# Patient Record
Sex: Female | Born: 1935 | Race: White | Hispanic: No | Smoking: Never smoker
Health system: Southern US, Community
[De-identification: ages and names within clinical notes are randomized; demographics above are authoritative.]

## PROBLEM LIST (undated history)

## (undated) DIAGNOSIS — E039 Hypothyroidism, unspecified: Secondary | ICD-10-CM

## (undated) DIAGNOSIS — M1990 Unspecified osteoarthritis, unspecified site: Secondary | ICD-10-CM

## (undated) DIAGNOSIS — I2510 Atherosclerotic heart disease of native coronary artery without angina pectoris: Secondary | ICD-10-CM

## (undated) DIAGNOSIS — I255 Ischemic cardiomyopathy: Secondary | ICD-10-CM

## (undated) DIAGNOSIS — I214 Non-ST elevation (NSTEMI) myocardial infarction: Secondary | ICD-10-CM

## (undated) DIAGNOSIS — C569 Malignant neoplasm of unspecified ovary: Secondary | ICD-10-CM

## (undated) DIAGNOSIS — N183 Chronic kidney disease, stage 3 (moderate): Secondary | ICD-10-CM

## (undated) DIAGNOSIS — I10 Essential (primary) hypertension: Secondary | ICD-10-CM

## (undated) DIAGNOSIS — I739 Peripheral vascular disease, unspecified: Secondary | ICD-10-CM

## (undated) DIAGNOSIS — R001 Bradycardia, unspecified: Secondary | ICD-10-CM

## (undated) DIAGNOSIS — I639 Cerebral infarction, unspecified: Secondary | ICD-10-CM

## (undated) DIAGNOSIS — I480 Paroxysmal atrial fibrillation: Secondary | ICD-10-CM

## (undated) DIAGNOSIS — K219 Gastro-esophageal reflux disease without esophagitis: Secondary | ICD-10-CM

## (undated) DIAGNOSIS — E785 Hyperlipidemia, unspecified: Secondary | ICD-10-CM

## (undated) DIAGNOSIS — C20 Malignant neoplasm of rectum: Secondary | ICD-10-CM

## (undated) DIAGNOSIS — F419 Anxiety disorder, unspecified: Secondary | ICD-10-CM

## (undated) DIAGNOSIS — E119 Type 2 diabetes mellitus without complications: Secondary | ICD-10-CM

## (undated) HISTORY — PX: CORONARY ARTERY BYPASS GRAFT: SHX141

## (undated) HISTORY — PX: TONSILLECTOMY: SUR1361

## (undated) HISTORY — PX: BACK SURGERY: SHX140

## (undated) HISTORY — PX: ABDOMINAL HYSTERECTOMY: SHX81

## (undated) HISTORY — PX: APPENDECTOMY: SHX54

---

## 1999-05-08 ENCOUNTER — Inpatient Hospital Stay (HOSPITAL_COMMUNITY): Admission: EM | Admit: 1999-05-08 | Discharge: 1999-05-17 | Payer: Self-pay | Admitting: Emergency Medicine

## 1999-05-08 ENCOUNTER — Encounter: Payer: Self-pay | Admitting: Cardiology

## 1999-05-12 ENCOUNTER — Encounter: Payer: Self-pay | Admitting: Thoracic Surgery (Cardiothoracic Vascular Surgery)

## 1999-05-13 ENCOUNTER — Encounter: Payer: Self-pay | Admitting: Thoracic Surgery (Cardiothoracic Vascular Surgery)

## 1999-05-15 ENCOUNTER — Encounter: Payer: Self-pay | Admitting: Emergency Medicine

## 2001-03-28 ENCOUNTER — Encounter: Payer: Self-pay | Admitting: Cardiovascular Disease

## 2001-03-28 ENCOUNTER — Ambulatory Visit (HOSPITAL_COMMUNITY): Admission: AD | Admit: 2001-03-28 | Discharge: 2001-03-29 | Payer: Self-pay | Admitting: Cardiovascular Disease

## 2001-12-11 ENCOUNTER — Encounter: Admission: RE | Admit: 2001-12-11 | Discharge: 2001-12-11 | Payer: Self-pay | Admitting: Orthopedic Surgery

## 2001-12-11 ENCOUNTER — Encounter: Payer: Self-pay | Admitting: Orthopedic Surgery

## 2001-12-12 ENCOUNTER — Ambulatory Visit (HOSPITAL_BASED_OUTPATIENT_CLINIC_OR_DEPARTMENT_OTHER): Admission: RE | Admit: 2001-12-12 | Discharge: 2001-12-12 | Payer: Self-pay | Admitting: Orthopedic Surgery

## 2002-11-26 ENCOUNTER — Ambulatory Visit (HOSPITAL_COMMUNITY): Admission: RE | Admit: 2002-11-26 | Discharge: 2002-11-27 | Payer: Self-pay | Admitting: Cardiovascular Disease

## 2005-01-22 ENCOUNTER — Encounter: Admission: RE | Admit: 2005-01-22 | Discharge: 2005-01-22 | Payer: Self-pay | Admitting: Cardiovascular Disease

## 2005-01-28 ENCOUNTER — Ambulatory Visit (HOSPITAL_COMMUNITY): Admission: RE | Admit: 2005-01-28 | Discharge: 2005-01-28 | Payer: Self-pay | Admitting: Cardiovascular Disease

## 2005-06-17 ENCOUNTER — Ambulatory Visit: Admission: RE | Admit: 2005-06-17 | Discharge: 2005-08-10 | Payer: Self-pay | Admitting: *Deleted

## 2005-06-18 ENCOUNTER — Ambulatory Visit: Payer: Self-pay | Admitting: Oncology

## 2005-07-02 ENCOUNTER — Ambulatory Visit: Payer: Self-pay | Admitting: Oncology

## 2005-10-29 ENCOUNTER — Ambulatory Visit: Payer: Self-pay | Admitting: Oncology

## 2006-01-21 ENCOUNTER — Ambulatory Visit: Payer: Self-pay | Admitting: Oncology

## 2006-04-15 ENCOUNTER — Ambulatory Visit: Payer: Self-pay | Admitting: Oncology

## 2006-08-05 ENCOUNTER — Ambulatory Visit: Payer: Self-pay | Admitting: Oncology

## 2006-10-28 ENCOUNTER — Ambulatory Visit: Payer: Self-pay | Admitting: Oncology

## 2006-12-21 ENCOUNTER — Ambulatory Visit: Payer: Self-pay | Admitting: Oncology

## 2007-02-15 ENCOUNTER — Ambulatory Visit: Payer: Self-pay | Admitting: Oncology

## 2007-04-13 ENCOUNTER — Ambulatory Visit: Payer: Self-pay | Admitting: Oncology

## 2007-11-27 ENCOUNTER — Ambulatory Visit (HOSPITAL_COMMUNITY): Admission: RE | Admit: 2007-11-27 | Discharge: 2007-11-28 | Payer: Self-pay | Admitting: Cardiovascular Disease

## 2008-11-22 ENCOUNTER — Inpatient Hospital Stay (HOSPITAL_COMMUNITY): Admission: AD | Admit: 2008-11-22 | Discharge: 2008-11-30 | Payer: Self-pay | Admitting: Cardiovascular Disease

## 2011-01-19 LAB — BASIC METABOLIC PANEL
BUN: 10 mg/dL (ref 6–23)
BUN: 18 mg/dL (ref 6–23)
BUN: 9 mg/dL (ref 6–23)
CO2: 19 mEq/L (ref 19–32)
Calcium: 8.5 mg/dL (ref 8.4–10.5)
Calcium: 8.9 mg/dL (ref 8.4–10.5)
Calcium: 9 mg/dL (ref 8.4–10.5)
Chloride: 110 mEq/L (ref 96–112)
Chloride: 117 mEq/L — ABNORMAL HIGH (ref 96–112)
Creatinine, Ser: 1.28 mg/dL — ABNORMAL HIGH (ref 0.4–1.2)
Creatinine, Ser: 1.36 mg/dL — ABNORMAL HIGH (ref 0.4–1.2)
Creatinine, Ser: 1.63 mg/dL — ABNORMAL HIGH (ref 0.4–1.2)
GFR calc Af Amer: 37 mL/min — ABNORMAL LOW (ref >60)
GFR calc Af Amer: 44 mL/min — ABNORMAL LOW (ref >60)
GFR calc non Af Amer: 31 mL/min — ABNORMAL LOW (ref >60)
GFR calc non Af Amer: 34 mL/min — ABNORMAL LOW (ref >60)
GFR calc non Af Amer: 36 mL/min — ABNORMAL LOW (ref >60)
GFR calc non Af Amer: 37 mL/min — ABNORMAL LOW (ref >60)
GFR calc non Af Amer: 40 mL/min — ABNORMAL LOW (ref >60)
Glucose, Bld: 118 mg/dL — ABNORMAL HIGH (ref 70–99)
Glucose, Bld: 134 mg/dL — ABNORMAL HIGH (ref 70–99)
Glucose, Bld: 148 mg/dL — ABNORMAL HIGH (ref 70–99)
Glucose, Bld: 63 mg/dL — ABNORMAL LOW (ref 70–99)
Potassium: 4.1 mEq/L (ref 3.5–5.1)
Potassium: 4.2 mEq/L (ref 3.5–5.1)
Sodium: 140 mEq/L (ref 135–145)
Sodium: 140 mEq/L (ref 135–145)
Sodium: 140 mEq/L (ref 135–145)
Sodium: 141 mEq/L (ref 135–145)
Sodium: 143 mEq/L (ref 135–145)

## 2011-01-19 LAB — GLUCOSE, CAPILLARY
Glucose-Capillary: 102 mg/dL — ABNORMAL HIGH (ref 70–99)
Glucose-Capillary: 106 mg/dL — ABNORMAL HIGH (ref 70–99)
Glucose-Capillary: 110 mg/dL — ABNORMAL HIGH (ref 70–99)
Glucose-Capillary: 128 mg/dL — ABNORMAL HIGH (ref 70–99)
Glucose-Capillary: 129 mg/dL — ABNORMAL HIGH (ref 70–99)
Glucose-Capillary: 132 mg/dL — ABNORMAL HIGH (ref 70–99)
Glucose-Capillary: 136 mg/dL — ABNORMAL HIGH (ref 70–99)
Glucose-Capillary: 137 mg/dL — ABNORMAL HIGH (ref 70–99)
Glucose-Capillary: 138 mg/dL — ABNORMAL HIGH (ref 70–99)
Glucose-Capillary: 144 mg/dL — ABNORMAL HIGH (ref 70–99)
Glucose-Capillary: 148 mg/dL — ABNORMAL HIGH (ref 70–99)
Glucose-Capillary: 153 mg/dL — ABNORMAL HIGH (ref 70–99)
Glucose-Capillary: 155 mg/dL — ABNORMAL HIGH (ref 70–99)
Glucose-Capillary: 155 mg/dL — ABNORMAL HIGH (ref 70–99)
Glucose-Capillary: 156 mg/dL — ABNORMAL HIGH (ref 70–99)
Glucose-Capillary: 160 mg/dL — ABNORMAL HIGH (ref 70–99)
Glucose-Capillary: 164 mg/dL — ABNORMAL HIGH (ref 70–99)
Glucose-Capillary: 167 mg/dL — ABNORMAL HIGH (ref 70–99)
Glucose-Capillary: 75 mg/dL (ref 70–99)

## 2011-01-19 LAB — HEPARIN LEVEL (UNFRACTIONATED)
Heparin Unfractionated: <0.1 IU/mL — ABNORMAL LOW (ref 0.30–0.70)
Heparin Unfractionated: <0.1 IU/mL — ABNORMAL LOW (ref 0.30–0.70)
Heparin Unfractionated: <0.1 IU/mL — ABNORMAL LOW (ref 0.30–0.70)

## 2011-01-19 LAB — CARDIAC PANEL(CRET KIN+CKTOT+MB+TROPI)
CK, MB: 2.9 ng/mL (ref 0.3–4.0)
CK, MB: 4.4 ng/mL — ABNORMAL HIGH (ref 0.3–4.0)
Relative Index: INVALID (ref 0.0–2.5)
Relative Index: INVALID (ref 0.0–2.5)
Total CK: 64 U/L (ref 7–177)
Total CK: 71 U/L (ref 7–177)
Total CK: 86 U/L (ref 7–177)
Troponin I: 0.22 ng/mL — ABNORMAL HIGH (ref 0.00–0.06)
Troponin I: 0.32 ng/mL — ABNORMAL HIGH (ref 0.00–0.06)
Troponin I: 0.38 ng/mL — ABNORMAL HIGH (ref 0.00–0.06)

## 2011-01-19 LAB — COMPREHENSIVE METABOLIC PANEL
ALT: 16 U/L (ref 0–35)
Albumin: 3.3 g/dL — ABNORMAL LOW (ref 3.5–5.2)
Alkaline Phosphatase: 75 U/L (ref 39–117)
BUN: 27 mg/dL — ABNORMAL HIGH (ref 6–23)
Calcium: 9.3 mg/dL (ref 8.4–10.5)
Glucose, Bld: 130 mg/dL — ABNORMAL HIGH (ref 70–99)
Potassium: 3.4 mEq/L — ABNORMAL LOW (ref 3.5–5.1)
Sodium: 139 mEq/L (ref 135–145)
Total Protein: 6.3 g/dL (ref 6.0–8.3)

## 2011-01-19 LAB — CBC
HCT: 32.8 % — ABNORMAL LOW (ref 36.0–46.0)
HCT: 33.3 % — ABNORMAL LOW (ref 36.0–46.0)
HCT: 34.2 % — ABNORMAL LOW (ref 36.0–46.0)
Hemoglobin: 11.4 g/dL — ABNORMAL LOW (ref 12.0–15.0)
Hemoglobin: 11.6 g/dL — ABNORMAL LOW (ref 12.0–15.0)
Hemoglobin: 11.6 g/dL — ABNORMAL LOW (ref 12.0–15.0)
Hemoglobin: 11.7 g/dL — ABNORMAL LOW (ref 12.0–15.0)
Hemoglobin: 12.4 g/dL (ref 12.0–15.0)
MCHC: 34.2 g/dL (ref 30.0–36.0)
MCHC: 34.8 g/dL (ref 30.0–36.0)
MCHC: 34.9 g/dL (ref 30.0–36.0)
MCV: 91.4 fL (ref 78.0–100.0)
Platelets: 137 10*3/uL — ABNORMAL LOW (ref 150–400)
Platelets: 149 10*3/uL — ABNORMAL LOW (ref 150–400)
Platelets: 153 10*3/uL (ref 150–400)
Platelets: 157 10*3/uL (ref 150–400)
RBC: 3.6 MIL/uL — ABNORMAL LOW (ref 3.87–5.11)
RDW: 12.8 % (ref 11.5–15.5)
RDW: 12.9 % (ref 11.5–15.5)
RDW: 13 % (ref 11.5–15.5)
RDW: 13.1 % (ref 11.5–15.5)
RDW: 13.2 % (ref 11.5–15.5)
WBC: 3.7 10*3/uL — ABNORMAL LOW (ref 4.0–10.5)
WBC: 4.7 10*3/uL (ref 4.0–10.5)
WBC: 6.4 10*3/uL (ref 4.0–10.5)

## 2011-01-19 LAB — APTT
aPTT: 32 seconds (ref 24–37)
aPTT: 50 seconds — ABNORMAL HIGH (ref 24–37)

## 2011-01-19 LAB — DIFFERENTIAL
Lymphs Abs: 2.2 10*3/uL (ref 0.7–4.0)
Monocytes Absolute: 0.5 10*3/uL (ref 0.1–1.0)
Monocytes Relative: 7 % (ref 3–12)
Neutro Abs: 4.9 10*3/uL (ref 1.7–7.7)
Neutrophils Relative %: 62 % (ref 43–77)

## 2011-01-19 LAB — PROTIME-INR
INR: 1 (ref 0.00–1.49)
Prothrombin Time: 13.8 seconds (ref 11.6–15.2)
Prothrombin Time: 13.9 seconds (ref 11.6–15.2)

## 2011-01-19 LAB — BRAIN NATRIURETIC PEPTIDE: Pro B Natriuretic peptide (BNP): 381 pg/mL — ABNORMAL HIGH (ref 0.0–100.0)

## 2011-01-19 LAB — HEMOGLOBIN A1C: Mean Plasma Glucose: 171 mg/dL

## 2011-01-19 LAB — LIPID PANEL
Total CHOL/HDL Ratio: 3.3 RATIO
VLDL: 13 mg/dL (ref 0–40)

## 2011-01-19 LAB — MAGNESIUM: Magnesium: 1.8 mg/dL (ref 1.5–2.5)

## 2011-01-19 LAB — TSH: TSH: 2.63 u[IU]/mL (ref 0.350–4.500)

## 2011-01-25 ENCOUNTER — Inpatient Hospital Stay (HOSPITAL_COMMUNITY)
Admission: AD | Admit: 2011-01-25 | Discharge: 2011-01-27 | DRG: 301 | Disposition: A | Discharge status: Home / Self Care | Payer: Medicare Other | Source: Ambulatory Visit | Attending: Cardiovascular Disease | Admitting: Cardiovascular Disease

## 2011-01-25 DIAGNOSIS — I2510 Atherosclerotic heart disease of native coronary artery without angina pectoris: Secondary | ICD-10-CM | POA: Diagnosis present

## 2011-01-25 DIAGNOSIS — Z7982 Long term (current) use of aspirin: Secondary | ICD-10-CM

## 2011-01-25 DIAGNOSIS — Z794 Long term (current) use of insulin: Secondary | ICD-10-CM

## 2011-01-25 DIAGNOSIS — E785 Hyperlipidemia, unspecified: Secondary | ICD-10-CM | POA: Diagnosis present

## 2011-01-25 DIAGNOSIS — Z880 Allergy status to penicillin: Secondary | ICD-10-CM

## 2011-01-25 DIAGNOSIS — I129 Hypertensive chronic kidney disease with stage 1 through stage 4 chronic kidney disease, or unspecified chronic kidney disease: Secondary | ICD-10-CM | POA: Diagnosis present

## 2011-01-25 DIAGNOSIS — Z951 Presence of aortocoronary bypass graft: Secondary | ICD-10-CM

## 2011-01-25 DIAGNOSIS — E119 Type 2 diabetes mellitus without complications: Secondary | ICD-10-CM | POA: Diagnosis present

## 2011-01-25 DIAGNOSIS — N183 Chronic kidney disease, stage 3 unspecified: Secondary | ICD-10-CM | POA: Diagnosis present

## 2011-01-25 DIAGNOSIS — I739 Peripheral vascular disease, unspecified: Principal | ICD-10-CM | POA: Diagnosis present

## 2011-01-25 DIAGNOSIS — Z7902 Long term (current) use of antithrombotics/antiplatelets: Secondary | ICD-10-CM

## 2011-01-25 LAB — GLUCOSE, CAPILLARY: Glucose-Capillary: 236 mg/dL — ABNORMAL HIGH (ref 70–99)

## 2011-01-25 LAB — PROTIME-INR
INR: 0.95 (ref 0.00–1.49)
Prothrombin Time: 12.9 seconds (ref 11.6–15.2)

## 2011-01-25 LAB — CBC
Hemoglobin: 10.6 g/dL — ABNORMAL LOW (ref 12.0–15.0)
MCH: 27.3 pg (ref 26.0–34.0)
MCHC: 31.8 g/dL (ref 30.0–36.0)
Platelets: 154 10*3/uL (ref 150–400)
RBC: 3.88 MIL/uL (ref 3.87–5.11)

## 2011-01-25 LAB — BASIC METABOLIC PANEL
CO2: 20 mEq/L (ref 19–32)
Calcium: 8 mg/dL — ABNORMAL LOW (ref 8.4–10.5)
Creatinine, Ser: 1.51 mg/dL — ABNORMAL HIGH (ref 0.4–1.2)
GFR calc Af Amer: 40 mL/min — ABNORMAL LOW (ref >60)
GFR calc non Af Amer: 33 mL/min — ABNORMAL LOW (ref >60)
Glucose, Bld: 236 mg/dL — ABNORMAL HIGH (ref 70–99)
Sodium: 140 mEq/L (ref 135–145)

## 2011-01-26 ENCOUNTER — Ambulatory Visit (HOSPITAL_COMMUNITY): Admission: RE | Admit: 2011-01-26 | Payer: Medicare Other | Source: Ambulatory Visit | Admitting: Cardiovascular Disease

## 2011-01-26 HISTORY — PX: VASCULAR SURGERY: SHX849

## 2011-01-26 LAB — GLUCOSE, CAPILLARY
Glucose-Capillary: 90 mg/dL (ref 70–99)
Glucose-Capillary: 81 mg/dL (ref 70–99)
Glucose-Capillary: 86 mg/dL (ref 70–99)

## 2011-01-26 LAB — BASIC METABOLIC PANEL
GFR calc Af Amer: 48 mL/min — ABNORMAL LOW (ref >60)
GFR calc non Af Amer: 40 mL/min — ABNORMAL LOW (ref >60)
Glucose, Bld: 77 mg/dL (ref 70–99)
Potassium: 3.3 mEq/L — ABNORMAL LOW (ref 3.5–5.1)
Sodium: 143 mEq/L (ref 135–145)

## 2011-01-26 LAB — POCT ACTIVATED CLOTTING TIME: Activated Clotting Time: 222 seconds

## 2011-01-26 LAB — MAGNESIUM: Magnesium: 1.9 mg/dL (ref 1.5–2.5)

## 2011-01-27 LAB — BASIC METABOLIC PANEL
CO2: 22 mEq/L (ref 19–32)
GFR calc Af Amer: 42 mL/min — ABNORMAL LOW (ref >60)
Glucose, Bld: 118 mg/dL — ABNORMAL HIGH (ref 70–99)
Potassium: 4.5 mEq/L (ref 3.5–5.1)
Sodium: 143 mEq/L (ref 135–145)

## 2011-01-27 LAB — CBC
HCT: 31.8 % — ABNORMAL LOW (ref 36.0–46.0)
Hemoglobin: 10.1 g/dL — ABNORMAL LOW (ref 12.0–15.0)
RBC: 3.66 MIL/uL — ABNORMAL LOW (ref 3.87–5.11)
WBC: 6.9 10*3/uL (ref 4.0–10.5)

## 2011-01-27 NOTE — Procedures (Signed)
[name], [name]                ACCOUNT NO.:  [id number]  MEDICAL RECORD NO.:  [id number]           PATIENT TYPE:  I  LOCATION:  6531                         FACILITY:  [facility]  PHYSICIAN:  Nanetta Batty, M.D.   DATE OF BIRTH:  19-Aug-1925  DATE OF PROCEDURE: DATE OF DISCHARGE:                   PERIPHERAL VASCULAR INVASIVE PROCEDURE   [name] is an 77 year old female with history of CAD and PVOD.  She had coronary artery bypass grafting in 2000.  She had a left SFA PTA and stenting with pre- dilatation for "in-stent restenosis" in February 2009.  Subsequent Dopplers that showed occlusion of her left SFA.  The problems include hypertension, hyperlipidemia, and diabetes.  She has had colon cancer in the past treated with chemotherapy and radiation therapy.  Her last functional study performed September 03, 2009, was nonischemic.  Lower extremity Dopplers performed on July 29, 2009, revealed a right ABI 0.71 and the left 2.48.  She had non-ST segment elevation myocardial infarction in February 2010.  She was catheterized revealing an occluded OM graft, patent into the LAD, patent into the diagonal branch.  She did have a ramus branch stenosis as well which was intervened several years later.  She has noticed progressive right greater than left lower extremity claudication.  Dopplers showed an occlusion of her right common femoral artery.  She presents now for angiography and potential intervention for lifestyle-limiting claudication.  PROCEDURE DESCRIPTION:  The patient was brought to the [facility] angiographic suite in the post absorptive state.  She was premedicated with p.o. Valium.  Her left groin was prepped and shaved in the usual sterile fashion.  Xylocaine 1% was used for local anesthesia. A 5-French sheath was inserted into the left femoral artery using standard Seldinger technique.  A 5-French tennis racket catheter was used for abdominal aortography  with bifemoral runoff using bolus-chase digital subtraction step table technique.  Visipaque dye was used for entirety of the case.  Aortic pressures were monitored during the case.  ANGIOGRAPHIC RESULTS: 1. Abdominal aorta.     a.     Renal arteries - right renal artery FMD.     b.     Infrarenal abdominal aorta - normal. 2. Right lower extremity:     a.     Total short segment occlusion right common femoral artery.     b.     75% popliteal with focal stenosis.     c.     One-vessel runoff to the peroneal. 3. Left lower extremity;     a.     Total mid left SFA involving the previously placed stent      with two-vessel runoff.  PROCEDURE DESCRIPTION:  The patient received 5000 units of heparin intravenously.  Contralateral access was obtained with a crossover catheter, Versacore wire and 7-French Pinnacle sheath.  A total of 225 mL of contrast was utilized during the case.  The ACT was 222. Estimated blood loss less than 5 mL.  Short segment occlusion was crossed with a short 0.035 Quick-Cross and angled glidewire.  This was then exchanged for a Versacore wire and PTA was performed of the total  occlusion with a 4 x 4 balloon upgraded to a 5 x 2 balloon.  Following this, an 8 x 16 Abbott Absolute Pro nitinol self-expanding stent was then deployed under careful fluoroscopic and angiographic control covering the lesion extending about the left femoral head.  This was postdilated with a 64 x 72 balloon resulting reduction of a short segment total occlusion to 0% residual.  The patient tolerated the procedure well.  The sheath was withdrawn across the bifurcation.  The patient will be hydrated at night, treated with aspirin and Plavix, the sheath will be removed once the ACT falls below 150 and pressure will be held in the groin to achieve hemostasis.  The patient left the lab in stable condition.     Nanetta Batty, M.D.     JB/MEDQ  D:  01/26/2011  T:  01/26/2011  Job:   [id number]  cc:   [facility] Arlan Organ  Electronically Signed by Nanetta Batty M.D. on 01/27/2011 04:13:44 PM

## 2011-02-09 NOTE — Discharge Summary (Signed)
Catherine Clayton, Catherine Clayton                ACCOUNT NO.:  [id number]  MEDICAL RECORD NO.:  [id number]           PATIENT TYPE:  I  LOCATION:  6531                         FACILITY:  [facility]  PHYSICIAN:  Nanetta Batty, M.D.   DATE OF BIRTH:  1925/01/31  DATE OF ADMISSION:  01/25/2011 DATE OF DISCHARGE:  01/27/2011                              DISCHARGE SUMMARY   DISCHARGE DIAGNOSES: 1. Peripheral vascular disease status post PV angiogram and PTA and     stent with an Absolute Pro to the right common femoral artery and     100% short segment stenosis was reduced to 0%.  The patient needs     stage intervention to the left superficial femoral artery. 2. History of coronary artery disease status post coronary artery     bypass graft in 2000.  PCI October in 2010 and low-risk nuclear     stress test in March 2012. 3. Ejection fraction 40-50%.  A 2-D echo March 2012. 4. Insulin-dependent diabetes mellitus. 5. History of hypertension. 6. Dyslipidemia. 7. Chronic kidney disease stage III  HOSPITAL COURSE:  Catherine Clayton is a 77 year old Caucasian female with a history of hypertension, peripheral vascular disease, and coronary artery disease status post coronary bypass grafting in 2000, hyperlipidemia, diabetes mellitus insulin dependent, chronic kidney disease stage III.  She presented for lower extremity peripheral angiogram, abdominal aortogram, extremity angiogram.  She had right renal FMD.  She had a normal infrarenal abdominal aorta and total short segment occlusion of the right common femoral artery along with 75% popliteal focal stenosis, 1-vessel runoff to the peroneal.  She also had a total mid left superficial femoral artery involving the previously placed stent with 2-vessel runoff.  She received an Abbot Absolute Pro self-expanding stent to the right short segment stenosis resulting in 100% lesion and reduced to 0%.  During the course of her stay prior to the procedure, the patient was  noted to have bilateral lower extremities spasming and 8/10 sharp shooting pain.  This lasted for about 5 minutes. Pedal pulses were notably weak.  After about 5 minutes, the pain decreased to 1/10.  Currently the patient is chest pain free, shortness of breath free.  No pain in her lower extremity.  The patient has been seen by Dr. Allyson Sabal, feels she is stable for discharge with lower extremity Doppler's, followup office visit, and then plans for staged PTA to the left lower extremity.  DISCHARGE LABS:  WBC 6.9, hemoglobin 10.1, hematocrit 31.8, platelets 166,000.  Sodium 143, potassium 4.5, chloride 113, carbon dioxide 22, BUN 14, creatinine 1.44, glucose 118, calcium 8.8.  STUDIES/PROCEDURES:  Peripheral vascular invasive procedure.  Angiogram noted right renal artery FMD.  Infrarenal abdominal aorta was normal. Right lower extremity showed total short segment occlusion in right common femoral artery and 75% popliteal with focal stenosis.  One-vessel runoff to the peroneal status post Abbott Absolute Pro self-expanding stent to that lesion, 100% occlusion was reduced to 0%.  Left lower extremity showed total mid left SFA involving the previously placed stent with 2 vessel run-off.  DISCHARGE MEDICATIONS: 1. Aspirin 325 mg 1 tablet by mouth  daily. 2. Aciphex 20 mg 1 tablet by mouth twice daily. 3. Atorvastatin 40 mg 1 tablet by mouth daily. 4. Calcium carbonate/vitamin D over-the-counter 1 tablet by mouth     daily. 5. Plavix 75 mg 1 tablet by mouth daily. 6. Coreg 6.25 mg 1 tablet by mouth twice daily, one at 8:00 a.m., one     at 7 p.m. 7. Fosamax 70 mg 1 tablet by mouth weekly.  The patient takes it on     Mondays. 8. Humalog insulin sliding scale if greater than 150 and the patient     takes 5 units. 9. Lantus 35 units subcutaneously in the morning. 10.Lasix 20 mg 1 tablet by mouth daily. 11.Levothyroxine 25 mcg 1 tablet by mouth every morning. 12.Promethazine 25 mg 1 tablet  by mouth daily as needed for nausea. 13.Vitamin D1 125 mg 1 tablet by mouth weekly.  The patient takes it     on Tuesdays.  DISPOSITION:  Catherine Clayton was discharged home in stable condition.  It is recommended she increase her activity slowly, may shower and bathe, no lifting for 2 days.  She is also recommended to eat low-sodium, heart- healthy diet.  She will follow up with [facility] for lower extremity Doppler ultrasound May 7 at 3:30 p.m. and then also with Dr. Allyson Sabal, May 9 at 3:45 p.m.    ______________________________ Wilburt Finlay, PA   ______________________________ Nanetta Batty, M.D.    BH/MEDQ  D:  01/27/2011  T:  01/27/2011  Job:  [id number]  cc:   Nanetta Batty, M.D. [place] Charon  Electronically Signed by Wilburt Finlay PA on 01/29/2011 11:23:05 AM Electronically Signed by Nanetta Batty M.D. on 02/09/2011 07:38:08 AM

## 2011-02-16 NOTE — Cardiovascular Report (Signed)
NAMECHENOA, Catherine Clayton                ACCOUNT NO.:  [id number]   MEDICAL RECORD NO.:  [id number]          PATIENT TYPE:  INP   LOCATION:  2503                         FACILITY:  [facility]   PHYSICIAN:  Cristy Hilts. Jacinto Halim, MD       DATE OF BIRTH:  05-Apr-1925   DATE OF PROCEDURE:  11/27/2008  DATE OF DISCHARGE:                            CARDIAC CATHETERIZATION   PROCEDURE PERFORMED:  PTCA and stenting of the ramus intermediate  branch.   INDICATIONS:  [name] is an 77 year old female who was admitted  to hospital with unstable angina.  She underwent diagnostic cardiac  catheterization 2 days ago by Dr. Allyson Sabal and was found to have a high-  grade stenosis of the ramus intermediate branch, which was fairly large.  Given her chronic renal insufficiency, she was scheduled for an elective  PCI for the same.   INTERVENTION DATA:  It appeared to be a difficult procedure.  Successful  PTCA and stenting of the mid segment of the ramus intermediate with  implantation of a 2.25 x 12-mm Multi-Link Mini Vision deployed at 10  atmospheres pressure, again dilated at 14 atmospheres pressure.  Stenosis was overall reduced from 99%-0% with brisk TIMI 3 flow  maintained at the end of the procedure.   The proximal segment of the lesion had stenosis of 68% and was appearing  hazy when the balloon and wire were there.  I attempted to stent this  segment also, but because of inability to deliver a stent in spite of 2-  wire support, the procedure was abandoned and post wire and balloon  withdrawal angiography revealed excellent results.   A total of 115 mL of contrast was utilized for interventional procedure.   TECHNIQUE OF PROCEDURE:  Under sterile precautions using a 7-French  right femoral artery access, 7-French XL-4 guide was utilized in the  left main coronary artery.  Using Angiomax for anticoagulation, I was  able to advance an [employer] into the ramus intermediate branch  with  mild-to-moderate difficulty.  I had difficulty in advancing a 2.5 x  15-mm Voyager balloon into the lesion.  However, once I got the balloon  at the site, I performed low-pressure inflations around 5-6 atmospheres  pressure for a minute.  Having performed this, there was haziness and  dissection noted.  Hence, we had to stent this.  A 2.5 x 23-mm Vision  stent was unable to be delivered, then a 2.25 x 24-mm unable to be  delivered; hence I again dilated the entire proximal segment of the  ramus intermediate branch proximal to the stenosis to see if I can get  the stent down and again attempted to use a prior stent because of again  inability to do so.  A 2.24 x 12-mm Mini Vision was utilized at this  time.  With great difficulty, I was able to advance the lesion site and  deployed at 10 atmospheres pressure dilated the same stent balloon at 14  atmospheres pressure.  Again, I dilated into the proximal portion of the  artery to see if I can take  another stent for proximal lesion, but  because of inability to so, I withdrew all the wires and the balloon and  angiography revealed excellent results.  The patient tolerated the  procedure.  There were no immediate complications noted.      Cristy Hilts. Jacinto Halim, MD  Electronically Signed    JRG/MEDQ  D:  11/27/2008  T:  11/28/2008  Job:  [id number]

## 2011-02-16 NOTE — Discharge Summary (Signed)
[name], Catherine Clayton                ACCOUNT NO.:  [id number]   MEDICAL RECORD NO.:  [id number]          PATIENT TYPE:  OBV   LOCATION:  2031                         FACILITY:  [facility]   PHYSICIAN:  Abelino Derrick, P.A.   DATE OF BIRTH:  1925-10-02   DATE OF ADMISSION:  11/27/2007  DATE OF DISCHARGE:  11/28/2007                               DISCHARGE SUMMARY   DISCHARGE DIAGNOSES:  1. Peripheral vascular disease status post left SFA PTA this      admission.  2. Coronary disease, coronary artery bypass grafting in 2000.  3. History of colon cancer status post chemotherapy and radiation      therapy.  4. Preserved left ventricular function.  5. Treated dyslipidemia.  6. Treated hypertension.  7. Insulin-dependent diabetes.  8. Penicillin allergy.   HOSPITAL COURSE:  The patient is an 77 year old female followed by Dr.  Allyson Sabal with vascular disease and coronary disease.  She had bypass  grafting in 2000.  She had a functional study in April of 2006 which was  nonischemic.  Outpatient Dopplers suggested a decrease in her ABI  suggesting in-stent restenosis in her left leg.  She was admitted for  elective angiogram.  This was done on November 27, 2007.  The proximal  left SFA stent was patent at 30-40%.  The distal SFA stent was  restenosed at 95%; this was dilated to less than 20.  The patient does  have residual vascular disease in her carotids by exam.  She will need  follow up carotid evaluation.  She does have renal insufficiency, and we  held her diuretic and ARB and hydrated her.  Her creatinine at discharge  is stable at 1.4.  She will follow up with Dr. Allyson Sabal in a couple weeks.   DISCHARGE MEDICATIONS:  1. Fosamax weekly.  2. Aciphex 20 mg a day.  3. Zocor 40 mg a day.  4. Aspirin 81 mg a day.  5. Plavix 75 mg a day.  6. Coreg 6.25 b.i.d.  7. Lantus 40 units each a.m.  8. She is on Lasix 20 mg a day and Losartan 50 mg a day; these will be      held for 24 hours, and she  will resume them on November 29, 2007.   LABORATORY DATA:  White count 6.3, hemoglobin 11.2, hematocrit 32.6,  platelets 182.  Sodium 139, potassium 3.6, BUN 22, creatinine 1.4.   DISPOSITION:  The patient is discharged in stable condition.  She will  have a BMP Thursday this week.  She will follow up with Dr. Allyson Sabal as an  outpatient.      Abelino Derrick, P.ALenard Lance  D:  11/28/2007  T:  11/28/2007  Job:  [id number]

## 2011-02-16 NOTE — Discharge Summary (Signed)
Catherine Clayton, Catherine Clayton                ACCOUNT NO.:  [id number]   MEDICAL RECORD NO.:  [id number]          PATIENT TYPE:  INP   LOCATION:  2028                         FACILITY:  [facility]   PHYSICIAN:  Nanetta Batty, M.D.   DATE OF BIRTH:  02-May-1925   DATE OF ADMISSION:  11/22/2008  DATE OF DISCHARGE:  11/30/2008                               DISCHARGE SUMMARY   DISCHARGE DIAGNOSES:  1. Acute coronary syndrome with elevated troponin on admission.  2. Progressive coronary artery disease, status post cardiac      catheterization with progressed disease.  She had an occluded      saphenous vein graft to her obtuse marginal, high-grade ramus      intermedius tandem lesion.  3. Chronic kidney disease.  4. Insulin-dependent diabetes mellitus.  5. Prior history of coronary artery disease with a coronary artery      bypass graft in 2000.  6. Hyperlipidemia.  7. Sinus bradycardia.  8. Multiple premature ventricular contractions.  9. Normal ejection fraction of 50-55%.  10.Orthostasis during hospitalization, improved.  Her Cozaar and Lasix      continued to be on hold.   DISCHARGE MEDICATIONS:  1. Coreg 6.25 mg every day.  2. Aciphex 20 mg every day.  3. Zocor 40 mg every day.  4. Aspirin 81 mg 2 a day.  5. Calcium with vitamin D daily.  6. Plavix 75 mg every day.  7. Synthroid 25 mcg a day.  8. Lantus insulin 20 units in the morning.  9. Humulin insulin sliding scale p.r.n.  10.Phenergan 25 mg p.r.n.  11.Nitroglycerin 1/150 every 5 minutes x3 p.r.n. for chest pain      sublingual.  12.Fosamax 70 mg a week.  13.Vitamin D weekly.   DISCHARGE INSTRUCTIONS:  She was told not to drive.  She should not do  any strenuous activity for a week.  If she has problems with her groin,  she will call our office.  She will follow up with Dr. Allyson Sabal in 2 weeks.  Our office will call with an appointment.   LABORATORY DATA:  Sodium 140, potassium 4.0, chloride 113, CO2 19,  glucose 134, BUN 9,  creatinine 1.28.  Hemoglobin 10.4, hematocrit 30.5,  WBCs 4.6, and platelets 153.  Total cholesterol 113, triglycerides 66,  HDL 34, and LDL 66.  Hemoglobin A1c 7.6.  TSH 2.630.  BNP on admission  was 381.  CK-MB and troponin:  1. 86/4.4, troponin of 0.38.  2. 64/2.9, troponin of 0.32.  3. 75/3.0, troponin of 0.22.  4. 71/2.4, troponin of 0.31.   Chest x-ray showed mild cardiomegaly, increased and stable changes of  COPD on November 22, 2008.   HOSPITAL COURSE:  Ms. Reagor is an 77 year old widowed female patient of  Dr. Nanetta Batty.  She was seen in the office by Dr. Allyson Sabal on November 22, 2008.  She was having chest tightness.  She was admitted for  unstable angina.  She was put on IV heparin and IV nitroglycerin.  Her  troponins were elevated, not her CPK-MB.  She had some an SVT.  She  was  recommended to undergo cardiac catheterization.  This was performed on  November 25, 2008.  She has had an occluded SVG to her OM high grade or  ramus intermedius tandem lesions.  Dr. Allyson Sabal felt she needed stage PCI  and stenting and a staged schedule secondary to her creatinine level.  She was seen by Cardiac Rehab.  She was walked on November 27, 2008.  She underwent PCI by Dr. Phill Mutter.  She had a 2.5 x 12 Mini-Vision  stent.  The lesion reduced from 99 to 0% in her ramus intermedius.  Post  procedure, she did well.  She was seen by Cardiac Rehab.  She did have  some nausea and dizziness.  She was having some orthostasis and  medications were held.  She improved and by December 01, 2007, she was  considered stable for discharge home.  Her blood pressure is 123/55,  pulse was 53, respirations are 16, temperature was 98.1.  She was  referred to a [facility] Phase 2 program.      Lezlie Octave, N.P.      Nanetta Batty, M.D.  Electronically Signed    BB/MEDQ  D:  11/30/2008  T:  11/30/2008  Job:  [id number]   cc:   Arlan Organ

## 2011-02-16 NOTE — Cardiovascular Report (Signed)
NAME:  Catherine Clayton, VACCARO                ACCOUNT NO.:  [id number]   MEDICAL RECORD NO.:  [id number]          PATIENT TYPE:  AMB   LOCATION:  SDS                          FACILITY:  [facility]   PHYSICIAN:  Nanetta Batty, M.D.   DATE OF BIRTH:  [birth year]   DATE OF PROCEDURE:  DATE OF DISCHARGE:                            CARDIAC CATHETERIZATION   HISTORY OF PRESENT ILLNESS:  [name] is a delightful 77 year old mildly  overweight Caucasian female with history of CAD and PVD.  She has had  coronary artery bypass grafting in 2000.  Problems include PVD status  post left SFA, PT and stenting with three self-expanding stents.  Last  functional study performed April 2006 was nonischemic.  Recent Doppler  showed a decrease in the left ABI for 1.05-0.76 with high frequency  signal in the mid-left SFA suggesting in-stent restenosis.  She  presents now for angiography, potential intervention for secondary  patency.   DESCRIPTION OF PROCEDURE:  The patient was brought to the second floor  [facility] angiographic suite in the postabsorptive state.  She was  premedicated with p.o. Valium.  Her right groin was prepped and shaved  in the usual sterile fashion, and 1% Xylocaine was used local  anesthesia.  A 6-French 30-cm long Terumo crossover sheath was inserted  into the right femoral artery using standard Seldinger technique and  0.035 Wholey wire.  The crossover catheter was used to obtain  contralateral access.  Left lower extremity angiography was performed  using bolus chase digital subtraction step table technique.  Visipaque  dye was used for entirety of the case.  Aortic pressure was monitored in  the case.   ANGIOGRAPHIC RESULTS:  There was a 50% in-stent restenosis within the  proximal left SFA stent.  There was a 95% fairly focal in-stent  restenosis within the mid left SFA stent and 0.05 stent overlap.  There  was a 40% stenosis distal to less within the stent and two-vessel runoff  of the  anterior tibial.   DESCRIPTION OF PROCEDURE:  The patient received 2500 units of heparin  intravenously.  Wholey wire was then placed across the lesion and PTA  was performed using a 60 Powerflex at nominal pressures.  A pullback  gradient was then performed using an end-hole revealing no gradient  across the mid left SFA stent.  The final angiographic result was  reduction in 95% stenosis to less than 20% residual.  The patient  tolerated the procedure well.  Sheath was then withdrawn across the  bifurcation and exchanged for a short 11 cm 6-French sheath.  ACT was  measured less than 200.  Sheath will be removed.  Pressure will be held  to the groin to achieve hemostasis.  The  patient left lab in stable condition.  She will be hydrated with bicarb  drip protocol and given Mucomyst for radiocontrast prophylaxis.  Renal  function will be assessed in the morning.  She will be discharged home.  She will get follow-up Dopplers and ABIs and we will see her back after  that.  She left lab in stable  condition.      Nanetta Batty, M.D.  Electronically Signed     JB/MEDQ  D:  11/27/2007  T:  11/28/2007  Job:  [id number]   cc:   [facility]  [facility]  Arlan Organ

## 2011-02-16 NOTE — Cardiovascular Report (Signed)
Catherine Clayton, Catherine Clayton NO.:  [id number]   MEDICAL RECORD NO.:  [id number]          PATIENT TYPE:  INP   LOCATION:  4706                         FACILITY:  [facility]   PHYSICIAN:  Nanetta Batty, M.D.   DATE OF BIRTH:  [birth year]   DATE OF PROCEDURE:  11/25/2008  DATE OF DISCHARGE:                            CARDIAC CATHETERIZATION   INDICATIONS:  Catherine Clayton is a delightful 77 year old mildly overweight,  widowed white female who I last saw in the office on November 22, 2008  accompanied by her daughter.  She has a history of CAD status post  bypass grafting in 2000.  She has also has PAD status post SFA  intervention by myself in the past.  Other problems include  hypertension, hyperlipidemia as well as colon cancer treated.  When I  saw her in the office, she was awakened with chest pain radiating to  both arms the night before.  In the office, she was comfortable and  stable.  A troponin was drawn after she left the office which was mildly  positive and she was admitted in the hospital, heparinized, and presents  now for elective diagnostic coronary arteriography to define her anatomy  and rule out ischemic etiology.   DESCRIPTION OF PROCEDURE:  The patient was brought to the Second Floor  [facility] in the postabsorptive state.  She was  premedicated with p.o. Valium and IV fentanyl.  Her right groin was  prepped and shaved in the usual sterile fashion.  Xylocaine 1% was used  for local anesthesia.  A 6-French sheath was inserted into the right  femoral artery using standard Seldinger technique.  A 6-French right-  left Judkins diagnostic catheter as well as 6-French pigtail catheter  were used for selective cholangiography, left ventriculography,  selective vein graft angiography, selective IMA angiography, and  supravalvular aortography.  Visipaque dye was used for the entirety of  the case.  The thoracic aorta, left ventricular, and pullback  pressures  were recorded.   HEMODYNAMICS:  1. Aortic systolic pressure 159, diastolic pressure 69.  2. Left ventricular systolic pressure 154, end-diastolic pressure 13.   SELECTIVE CHOLANGIOGRAPHY:  1. Left main; left main had approximately 25% distal tapered stenosis.  2. LAD; the LAD was occluded proximally after the first septal and      perforated small diagonal branch.  3. Left circumflex; left circumflex gave off a large ramus branch that      had tandem 90-95% stenosis in the proximal mid third with a small      branch arising from the proximal stenosis.  4. Left circumflex; this was a large in extent but small in caliber      vessel that gave off a distal PLA branch.  There were no other      obtuse marginal branches noted.  5. Right coronary; this was a dominant vessel with multiple 80-90%      stenosis throughout the proximal and midportion.  There was      competitive flow noted in the PDA graft.  6. Vein graft to the PDA, widely  patent.  7. Vein graft to the diagonal, widely patent.  8. LIMA to LAD, widely patent.  9. Vein graft to the circumflex OM, occluded at the origin.  10.Left ventriculography; RAO left ventriculogram was performed using      25 mL of Visipaque dye at 12 mL per second.  The overall LVEF was      estimated at approximately 50% with mild anteroapical hypokinesia.  11.Supravalvular aortography; this was performed in order to visualize      graft coming off the aorta.  It was done in the LAO view using 20      mL of Visipaque dye at 20 mL per second.  There was no obvious      graft arising from the circumflex marker.  The diagonal graft did      not visualize.   IMPRESSION:  Catherine Clayton has an occluded circumflex obtuse marginal graft  with high-grade disease in the ramus branch.  I suspect this is the  cause of her unstable angina.  Because her creatinine was in the 15  range and the amount of contrast used during the case and the fact that  she  has been clinically stabilized, I elected to stage her ramus branch  intervention after gentle hydration and followup of her renal function.   ACT was measured and the sheath was removed.  Pressure was held on the  groin to achieve hemostasis.  The patient left the lab in stable  condition.      Nanetta Batty, M.D.  Electronically Signed     JB/MEDQ  D:  11/25/2008  T:  11/25/2008  Job:  [id number]   cc:   Second Floor [place] [facility]  [facility]  [place]

## 2011-02-19 NOTE — Discharge Summary (Signed)
[place]  Patient:    Catherine Clayton, Catherine Clayton                         MRN: [id number] Adm. Date:  [id number] Disc. Date: 03/29/01 Attending:  Berry, Jonathan [place] Dictator:   Marya Fossa, P.A. CC:         Lenise Herald, M.D.  Ruben Im, M.D.   Discharge Summary  DATE OF BIRTH:  10-Feb-1925  ADMISSION DIAGNOSES: 1. Peripheral vascular disease. 2. Coronary artery disease. 3. Non-insulin-dependent diabetes mellitus. 4. Hypertension. 5. Hyperlipidemia. 6. History of ovarian carcinoma status post chemotherapy, 1996. 7. Coronary artery disease - status post coronary artery bypass grafting.  DISCHARGE DIAGNOSES: 1. Peripheral vascular disease with left lower extremity claudication and    angiographic of occlusion of the mid to distal left superficial femoral    artery with three-vessel runoff, status post successful percutaneous    revascularization left superficial femoral artery. 2. Coronary artery disease. 3. Non-insulin-dependent diabetes mellitus. 4. Hypertension. 5. Hyperlipidemia. 6. History of ovarian carcinoma status post chemotherapy, 1996. 7. Coronary artery disease - status post coronary artery bypass grafting.  HISTORY OF PRESENT ILLNESS:  The patient is a 77 year old, married, white female patient of Dr. Jenne Campus who has a history of NIDDM, hypertension, hyperlipidemia, ovarian CA, and coronary artery disease status post CABG in 2000.  She has had complaints of left lower extremity claudication over the last several months.  An angiogram done at [facility] showed a short segmental occlusion of the mid to distal left SFA with three-vessel runoff. She is tentatively scheduled for fem-pop bypass grafting.  However, a discussion about percutaneous revascularization has occurred between the patient and her physician, and a decision was made to attempt percutaneous revascularization in hopes to prevent having to have lower  extremity bypass surgery.  PROCEDURE:  Left lower extremity angiogram with intervention to the mid distal left superficial femoral artery by Dr. Nanetta Batty March 28, 2001.  COMPLICATIONS:  None.  CONSULTATIONS:  None.  HOSPITAL COURSE:  The patient was admitted to [facility] on March 28, 2001 for elective peripheral intervention.  Preprocedure laboratory studies showed a BUN of 17, creatinine 1.1, and potassium 5.0.  CBC within normal limits.  INR 1.0.  Dr. Allyson Sabal proceeded with peripheral angiogram of the left lower extremity which showed a 100% occlusion at the mid to distal left SFA.  He proceeded with PTA and stenting using a 7 x 4 Smart stent.  He successfully reduced the lesion from 100% to 0%.  The patient tolerated the procedure well.  On March 29, 2001, the patient remained stable.  She had no complaints.  Groin was stable.  Pulses are intact at the left lower extremity.  The patient is felt stable for discharge to home.  DISCHARGE MEDICATIONS: 1. Plavix 75 mg a day - indefinitely. 2. Aspirin 325 a day - indefinitely. 3. Atenolol 25 mg. 4. Aciphex 20 mg. 5. Zocor 40 mg. 6. Vioxx 25 mg. 7. Lisinopril 5 mg. 8. Calcium 600 mg a day.  NOTE:  Because the patient is going to be on a combination of aspirin and Plavix therapy, we do recommend she take Vioxx on a p.r.n. basis only.  DISPOSITION/DISCHARGE ACTIVITY:  No strenuous activity, lifting more than five pounds, or driving for two days.  Low fat, low cholesterol, low salt diet. The patient may shower.  She is asked to the call the office with any problems or  questions.  Lower extremity Dopplers are set up to reevaluate the patency of the new stent April 11, 2001 at 3:30 p.m. at our [place] office.  FOLLOW-UP APPOINTMENT:  Follow up appointment is scheduled with Dr. Nanetta Batty for April 21, 2001 at 1:50 p.m.  If she has problems or questions in the interim, she is to call. DD:  03/29/01 TD:   03/29/01 Job: [id number]

## 2011-02-19 NOTE — H&P (Signed)
Catherine Clayton, DAUPHIN NO.:  [id number]   MEDICAL RECORD NO.:  [id number]          PATIENT TYPE:  OIB   LOCATION:  2866                         FACILITY:  [facility]   PHYSICIAN:  Nanetta Batty, M.D.   DATE OF BIRTH:  05-25-1925   DATE OF ADMISSION:  01/28/2005  DATE OF DISCHARGE:                                HISTORY & PHYSICAL   This is actually an update from an office note from December 21, 2004.   HISTORY OF PRESENT ILLNESS:  Catherine Clayton is a 77 year old female followed by  Dr. Jenne Campus and Dr. Julieanne Cotton.  She has an ischemic cardiomyopathy.  She had  bypass surgery in August 2000.  Her last EF was 35%.  She has peripheral  vascular disease and has had previous left SFA PTCA and stenting in 2004.  She denies claudication.  She recently had Doppler studies that suggested a  high-grade disease in her left SFA.  She saw Dr. Allyson Sabal on December 21, 2004.  He felt she should be admitted for elective angiography with possible  intervention.  Since she saw Dr. Allyson Sabal on December 21, 2004, she has not had  any new problems.  She denies any unusual chest pain or shortness of breath.  She denies any claudication.   PAST MEDICAL HISTORY:  Remarkable as noted above plus a history of non-  insulin-dependent diabetes, ovarian cancer treated with chemotherapy four  years ago, hypertension, hyperlipidemia and gastroesophageal reflux.   CURRENT MEDICATIONS:  1.  Fosamax once a week.  2.  Lasix 20 mg a day.  3.  AcipHex 20 mg a day.  4.  Zocor 40 mg a day.  5.  Aspirin 81 mg a day.  6.  Zestril 20 mg a day.  7.  Glucovance 2.5/500 mg in the evening and 5/500 mg in the morning.  8.  Plavix 75 mg a day.  9.  Coreg 25 mg b.i.d.  10. Lanoxin 0.125 mg a day.   ALLERGIES:  She is allergic to penicillin.   SOCIAL HISTORY:  She is a widow with two children and four grandchildren.  She is a nonsmoker.   FAMILY HISTORY:  Unremarkable.   REVIEW OF SYSTEMS:  Essentially unremarkable except  for noted above.   PHYSICAL EXAMINATION:  VITAL SIGNS:  Blood pressure 140/60, pulse 60,  respirations 12.  GENERAL:  She is a well-developed, well-nourished elderly female in no acute  distress.  HEENT:  Normocephalic.  Extraocular movements are intact.  Sclerae are  nonicteric.  NECK:  Without JVD.  A left bruit.  CHEST:  Normal S1 and S2.  ABDOMEN:  Nontender.  Bowel sounds are present.  EXTREMITIES:  Without edema.  She has 2+ femoral pulses and 1+ pedal pulses.  NEURO:  Grossly intact.   IMPRESSION:  1.  Abnormal Doppler studies suggesting high-grade disease in her mid left      superficial femoral artery.  2.  Known peripheral vascular disease, with prior left superficial femoral      artery percutaneous transluminal coronary angioplasty and stenting in      December  2004.  3.  Coronary artery disease, with coronary artery bypass grafting in 2000.  4.  Ischemic cardiomyopathy with an ejection fraction of 35%.  5.  Non-insulin-dependent diabetes.  6.  Treated hypertension.  7.  Treated dyslipidemia.   PLAN:  The patient is admitted now for peripheral angiography.      LKK/MEDQ  D:  01/28/2005  T:  01/28/2005  Job:  [id number]

## 2011-02-19 NOTE — Op Note (Signed)
Catherine Clayton. [facility]  Patient:    Catherine Clayton, Catherine Clayton Visit Number: [id number] MRN: [id number]          Service Type: DSU Location: [facility] Attending Physician:  Susa Day Dictated by:   Katy Fitch Naaman Plummer., M.D. Proc. Date: 12/12/01 Admit Date:  12/12/2001                             Operative Report  PREOPERATIVE DIAGNOSIS:  Chronic stage II/III impingement, left shoulder with acromioclavicular degenerative arthritis.  POSTOPERATIVE DIAGNOSIS:  Chronic stage II/III impingement, left shoulder with acromioclavicular degenerative arthritis.  Identification of degenerative, type 1 SLAP lesion and multiple loose bodies within the glenohumeral joint. Also significant adhesive capsulitis was identified with examination of the shoulder under anesthesia.  OPERATION PERFORMED: 1. Examination of left shoulder under general anesthesia followed by    manipulation of capsule to relieve adhesive capsulitis. 2. Glenohumeral diagnostic arthroscopy followed by debridement of degenerative    SLAP lesion and removal of loose bodies. 3. Arthroscopic subacromial decompression with bursectomy, coracoacromial    ligament release and acromioplasty. 4. Arthroscopic resection of the distal clavicle.  SURGEON:  Katy Fitch. Sypher, Montez Hageman., M.D.  ASSISTANT:  Jonni Sanger, P.A.  ANESTHESIA:  General orotracheal supplemented by interscalene block.  SUPERVISING ANESTHESIOLOGIST:  Dr. Gelene Mink.  INDICATIONS FOR PROCEDURE:  Catherine Clayton is a 77 year old woman with a history of type 2 diabetes, coronary artery disease, peripheral vascular disease and increasing pain in her left shoulder.  Clinical examination revealed signs of chronic impingement.  MRI obtained in [place], [place] documented signs of significant tendinopathy of the supraspinatus tendon, AC arthropathy and signs of a possible nonretracted rotator cuff tear.  She was referred for evaluation and  management of her shoulder predicament.  Clinical examination in the office revealed signs of acromioclavicular arthropathy, stage II or III impingement and mild adhesive capsulitis.  The MRI from [place] was reviewed.  We concurred with their findings of significant tendinopathy and acromial anatomy that predisposed her to impingement.  The acromioclavicular joint was noted to be degenerative with an unusual medial osteophyte which appeared to be partial calcification of the coracoacromial ligament.  We recommended diagnostic arthroscopy at this time with appropriate intervention including possible open rotator cuff repair.  DESCRIPTION OF PROCEDURE:  Catherine Clayton was brought to the operating room and placed in supine position on the operating table.  Interscalene block placed in the holding area by Dr. Gelene Mink led to complete anesthesia of the left forequarter.  Following induction of general orotracheal anesthesia, she was carefully positioned in beach chair position with the aid of a torso and head holder designed for shoulder arthroscopy.  The left shoulder was carefully examined under general anesthesia.  The range of motion interestingly was noted to be elevation 120 degrees limited by adhesive capsulitis, external rotation of 60 degrees limited by adhesive capsulitis and virtually no internal rotation.  With gentle manipulation, the elevation was increased to 175 degrees and the external rotation increased to 90, internal rotation 90 both at 90 degrees abduction.  Minimal resistance was required suggesting that this was primarily fibrovascular tissue adhesions being released.  The arm was then prepped with DuraPrep and draped in impervious arthroscopy drapes.  The arthroscope was introduced through standard posterior portal with blunt technique.  Diagnostic arthroscopy revealed a small degree of blood within the joint from the manipulation.  An unexpected finding was  several cartilaginous loose  bodies which were adherent to the anterior glenoid labrum between 2 and 4 oclock.  There was a degenerative type 1 SLAP lesion.  The anchor of the biceps at the superior labrum was intact.  The biceps tendon had minimal tendinopathy adjacent to the rotator interval.  The deep surface of the supraspinatus revealed a small partial thickness tear.  The infraspinatus and teres minor appeared normal.  The inferior recess was obliterated by fibrovascular tissue that had been recently ruptured and had a small amount of clot present within the inferior capsule.  The inferior recess was inflamed but otherwise normal.  The humeral head had grade 2 and 3 chondromalacia.  The glenoid had grade 2 and 3 chondromalacia.  An anterior portal was created with blunt technique followed by use of a 4.5 mm suction shaver to remove the loose bodies, debride the labrum, remove clot and debride the fibrovascular tissue from the inferior recess.  The arthroscopic equipment was then removed followed by placement of the scope in the subacromial space.  A very florid adhesive bursitis was noted.  A complete bursectomy was accomplished followed by use of bipolar cautery for hemostasis.  The coracoacromial ligament was released followed by leveling of the acromion to a type 1 morphology with a bur brought in posteriorly with lateral visualization.  The distal clavicle was quite prominent and had a very unusual anterior spur that was contacting the leading edge of the supraspinatus muscle.  This was carefully defined with a blunt probe and appeared to be in part calcification of the coracoclavicular ligaments.  A resection of the distal clavicle was performed removing approximately 18 mm of clavicle with the bur being brought in posteriorly and anteriorly with lateral visualization.  Photographic documentation of decompression was completed followed by hemostasis.  Thorough lavage of the  space removed all debris.  Circumference carefully inspected and found to have areas of adhesive bursitis and  significant tendinopathy at the anterior supraspinatus insertion.  However, there were no signs of retracted tear.  The arthroscopic equipment removed followed by repair of the portals with 3-0 nylon.  For aftercare, Ms. Perham was placed in a dressing of Xeroflo, sterile gauze and HypaFix followed by transfer to the recovery room.  She will be [facility] for observation of her vital signs, given her significant history of coronary artery disease. Dictated by:   Katy Fitch Naaman Plummer., M.D. Attending Physician:  Susa Day DD:  12/12/01 TD:  12/12/01 Job: [phone] [id number]

## 2011-02-19 NOTE — Procedures (Signed)
[place]. [facility]  Patient:    Catherine Clayton, Catherine Clayton                         MRN: [id number] Proc. Date: 03/28/01 Adm. Date:  [id number] Attending:  Berry, Jonathan [place]azil[place] CC:         Sixth Floor [facility]  Arlan Organ, M.D., [place], [place]  Lenise Herald, M.D.   Procedure Report  PROCEDURES PERFORMED:  Peripheral angiogram/percutaneous transluminal angiography and stent procedure.  HISTORY:  Catherine Clayton is a 77 year old married white female with a history of insulin-dependent diabetes mellitus, hypertension, hyperlipidemia.  She is status post coronary artery bypass grafting with LIMA to her LAD and vein graft to the RCA, diagonal branch, and OM.  She has had no further cardiovascular symptoms, but has noticed left lower extremity claudication. She underwent angiography in [facility], revealing short segment mid left FFA occlusion.  Fem-pop bypass grafting was recommended.  She saw Dr. Lenise Herald back, who requested a second opinion.  I reviewed the angiograms and felt that an endovascular attempt was worthwhile to revascularize her left lower extremity.  She presents now for that procedure.  PROCEDURE DESCRIPTION:  The patient was brought to the sixth floor [facility] in the postabsorptive state.  She was premedicated with p.o. Valium.  Her right groin was prepped and shaved in the usual sterile fashion.  One percent Xylocaine was used for local anesthesia. A 7-French crossover Balkan sheath was introduced using the standard Seldinger technique, an IMA, end-hole catheter, and 035 Wholey wire.  Visipaque dye was used for the entirety of the case.  Retrograde aortic pressure was monitored during the case.  Selective angiography was performed, as well as a left lower extremity runoff using step-table digital subtraction technique.  Using a 5-French long end-hole catheter  and a long 035 angled glide wire, recanalization was performed.  PTA is performed following this using a 5, upgraded to a 6 x 4, Powerflex.  A 7 x 4 smart stent was then deployed and postdilated with the 6 x 4 Powerflex, resulting in reduction of the short segment total occlusion to 0% residual stenosis without dissection.  The patient tolerated the procedure well.  Her ACT was measured and the sheaths were removed.  Pressure was held on the groin to achieve hemostasis.  The patient left the laboratory in stable condition.  She will be hydrated and discharged in the morning, with followup Dopplers in our office in one to two weeks and return office visit thereafter. Dr. Arlan Organ and Dr. Lenise Herald were notified of these results. DD:  03/28/01 TD:  03/28/01 Job: [id number]

## 2011-02-19 NOTE — Cardiovascular Report (Signed)
NAMEBUFFI, Catherine NO.:  [id number]   MEDICAL RECORD NO.:  [id number]                   PATIENT TYPE:  OIB   LOCATION:  2860                                 FACILITY:  [facility]   PHYSICIAN:  Nanetta Batty, M.D.                DATE OF BIRTH:  Aug 23, 1925   DATE OF PROCEDURE:  DATE OF DISCHARGE:                              CARDIAC CATHETERIZATION   PROCEDURES:  Peripheral vascular angiogram/PTA/stent placement.   INDICATIONS:  The patient is a 77 year old female with a history of CAD and  PVD.  She reportedly had coronary artery bypass grafting in the past, and  has mild LV dysfunction.  Her other problems include hypertension,  noninsulin-requiring diabetes, and hyperlipidemia.  I performed a short  SFA/PTA and stenting  and stenting 03/28/01.  Followup Dopplers performed  10/15/02 showed high frequency signal, and had mid left SFA in broad  distended segment with decreased left ABI.  The patient does complain of  some mild claudication, but for the most part, is fairly symptomatic.  Because of the high-frequency signal, and the concern that this may progress  to total occlusion, the patient presents now for angiography and  intervention.   PROCEDURE #1:  The patient was brought to the 6th floor [facility]  peripheral vascular angiographic suite in the postabsorptive state.  She was  premedicated with 2 of Valium.  Her abdomen was prepped and draped in the  usual sterile fashion.  Xylocaine 1% was used for local anesthesia.  A 5-  French sheath was inserted into the right femoral artery using the Seldinger  technique.  A 5-French tennis racquet catheter was used for midstream  additional abdominal aortography as well as bifemoral runoff.  A short IMA  catheter was used for contralateral access.  An 0.035 Wholey wire was used.  Visipaque dye was used through the entirety of the case.  The aortic  pressure were monitored at the end of the case.   ANGIOGRAPHIC RESULTS:  1. Abdominal aorta.     a. Renal artery -- mild right FMD.     b. Renal abdominal aorta-- normal.  2. Left lower extremity:     a. There was a focal 95% stenosis in the mid left SFA above the stented        segment.  There was a 70% hypodense lesion at the proximal edge of the        stent with three vessel runoff.  3. Right lower extremity:     a. A 50% mid, mid right SFA stenosis with three vessel runoff.   PROCEDURE #2:  Contralateral access is obtained with a 0.035 angled  Glidewire and a short IMA catheter.  A 7-French Balkan sheath was then  advanced over the bifurcation.  The patient received 25 units of heparin  intravenously.  PTA was performed at the proximal edge of the stent and  at  the tight focal lesion with a 5 x 2 Powerflex.  Stenting was performed with  7 x 2 SMART stents in both locations with post dilatation using a 6 x 2  Powerflex at low atmospheres resulting in reduction of the 95% mid and 70%  distal left SFA stenosis to 0% residual.  The patient tolerated the  procedure well.  Selective right renal artery angiography did reveal a mild  right renal artery FMD.   IMPRESSION:  Successful percutaneous transluminal angioplasty and stenting  of the left superficial femoral artery with an excellent angiographic  result.    DISPOSITION:  The guidewire and Balkan sheath was removed and a short 7-  French sheath replaced it in the right femoral artery.  ACT was measured and  all sheaths were removed.  Pressure was held on the groin to achieve  hemostasis.  The patient left the lab in stable condition.  She will be  discharged home in the morning, and obtain followup Doppler, after which she  will see me back in the office in followup.                                               Nanetta Batty, M.D.    Cordelia Pen  D:  11/26/2002  T:  11/26/2002  Job:  [id number]   cc:   [facility]   [place] Charon, Dr.   [facility] 6th  Floor Angio Suite

## 2011-02-19 NOTE — Cardiovascular Report (Signed)
Catherine Clayton, FATH NO.:  [id number]   MEDICAL RECORD NO.:  [id number]          PATIENT TYPE:  OIB   LOCATION:  2866                         FACILITY:  [facility]   PHYSICIAN:  Nanetta Batty, M.D.   DATE OF BIRTH:  [birth year]   DATE OF PROCEDURE:  01/28/2005  DATE OF DISCHARGE:                              CARDIAC CATHETERIZATION   INDICATIONS:  Ms. Sine is a 77 year old female with history of ischemic  cardiomyopathy status post bypass grafting in August 2000 with EF of 35%.  She has had left SFA, PTA and stenting December 2004.  The patient denies  claudication.  She denies stroke or diabetes, hypertension, and  hyperlipidemia.  Surveillance Doppler suggested high grade disease in her  mid left SFA.  She presents now for angiography and intervention for  preservation of her left SFA.   PROCEDURE DESCRIPTION:  The patient was brought to the sixth floor [facility] Peripheral Vascular Angiographic Suite in the postabsorptive state.  She was premedicated with p.o. Valium.  The right groin was prepped and  shaved in the usual sterile fashion.  1% Xylocaine was used for local  anesthesia.  A 5 French sheath was inserted into the right femoral artery  using standard Seldinger technique. A 5 [place] tennis racquet catheter along  with an 8 catheter were used for treatment of distal abdominal aortography,  bifemoral run off.  Visipaque dye was used for the entirety of the case.  Retrograde aortic pressure was monitored during the case.   ANGIOGRAPHIC RESULTS:  1.  Abdominal aorta:      1.  Renal arteries, normal.      2.  Infrarenal abdominal aorta, mild atherosclerotic narrowing.  2.  Left lower extremity:      1.  There was a 6 x 2 Smart stent in the proximal third of the left SFA          which had approximately 60-70% in-stent restenosis.  There are          overlapping 7 x 2 and 7 x 4 Smart stents in the mid left SFA with 70-          90% proximal in-stent   restenosis with three-vessel run off.  3.  Right lower extremity.      1.  Normal with three-vessel run off.   PROCEDURE DESCRIPTION:  Contralateral access was obtained with a 6 French  Terumo cross-over sheath and a 3.5 Wholey wire, and a cross-over catheter.  The patient received 3000 units of heparin intravenously.  Wholey wire was  then passed through both sets of stents into the distal left SFA.  PTA was  performed on the proximal stent with a 6 x 2 PowerFlex and the distal stent  with a 7 x 4 PowerFlex resulting in reduction of proximal 70% stenosis to  less than 20, and a mid 70-90% stenosis less than 20.  The patient had 2+  pedal pulses at the end of the procedure.  The  wire was removed and the right femoral arterial puncture site was  hemostatically sealed with  Star closure device with excellent hemostasis.  The patient left the lab in stable condition.  She will be discharged home  later today as an outpatient and will get followup Dopplers and ABIs.  She  will see me back in the office for followup.      JB/MEDQ  D:  01/28/2005  T:  01/28/2005  Job:  [id number]   cc:   [facility]  [address] [address]  [place], [place]  [address], [address]  [place]  [place] [address]  Fax: [phone]

## 2011-06-25 LAB — CBC
HCT: 32.6 — ABNORMAL LOW
Hemoglobin: 11.2 — ABNORMAL LOW
MCHC: 34.3
MCV: 91
Platelets: 182
RBC: 3.58 — ABNORMAL LOW
RDW: 12.1
WBC: 6.3

## 2011-06-25 LAB — BASIC METABOLIC PANEL
BUN: 22
CO2: 27
CO2: 27
Calcium: 8.4
Chloride: 105
Creatinine, Ser: 1.41 — ABNORMAL HIGH
GFR calc Af Amer: 40 — ABNORMAL LOW
GFR calc Af Amer: 43 — ABNORMAL LOW
GFR calc non Af Amer: 33 — ABNORMAL LOW
GFR calc non Af Amer: 36 — ABNORMAL LOW
Glucose, Bld: 154 — ABNORMAL HIGH
Glucose, Bld: 165 — ABNORMAL HIGH
Potassium: 3.6
Potassium: 4.6
Sodium: 138
Sodium: 139

## 2011-07-12 ENCOUNTER — Inpatient Hospital Stay (HOSPITAL_COMMUNITY): Payer: Medicare Other

## 2011-07-12 ENCOUNTER — Inpatient Hospital Stay (HOSPITAL_COMMUNITY)
Admission: EM | Admit: 2011-07-12 | Discharge: 2011-07-15 | DRG: 310 | Disposition: A | Discharge status: Home / Self Care | Payer: Medicare Other | Source: Other Acute Inpatient Hospital | Attending: Cardiology | Admitting: Cardiology

## 2011-07-12 DIAGNOSIS — I739 Peripheral vascular disease, unspecified: Secondary | ICD-10-CM | POA: Diagnosis present

## 2011-07-12 DIAGNOSIS — I472 Ventricular tachycardia, unspecified: Principal | ICD-10-CM | POA: Diagnosis present

## 2011-07-12 DIAGNOSIS — I4729 Other ventricular tachycardia: Principal | ICD-10-CM | POA: Diagnosis present

## 2011-07-12 DIAGNOSIS — Z880 Allergy status to penicillin: Secondary | ICD-10-CM

## 2011-07-12 DIAGNOSIS — Z7902 Long term (current) use of antithrombotics/antiplatelets: Secondary | ICD-10-CM

## 2011-07-12 DIAGNOSIS — Z66 Do not resuscitate: Secondary | ICD-10-CM | POA: Diagnosis present

## 2011-07-12 DIAGNOSIS — Z85038 Personal history of other malignant neoplasm of large intestine: Secondary | ICD-10-CM

## 2011-07-12 DIAGNOSIS — Z7982 Long term (current) use of aspirin: Secondary | ICD-10-CM

## 2011-07-12 DIAGNOSIS — I10 Essential (primary) hypertension: Secondary | ICD-10-CM | POA: Diagnosis present

## 2011-07-12 DIAGNOSIS — I2589 Other forms of chronic ischemic heart disease: Secondary | ICD-10-CM | POA: Diagnosis present

## 2011-07-12 DIAGNOSIS — Z794 Long term (current) use of insulin: Secondary | ICD-10-CM

## 2011-07-12 DIAGNOSIS — Z951 Presence of aortocoronary bypass graft: Secondary | ICD-10-CM

## 2011-07-12 DIAGNOSIS — E119 Type 2 diabetes mellitus without complications: Secondary | ICD-10-CM | POA: Diagnosis present

## 2011-07-12 DIAGNOSIS — E785 Hyperlipidemia, unspecified: Secondary | ICD-10-CM | POA: Diagnosis present

## 2011-07-12 DIAGNOSIS — Z888 Allergy status to other drugs, medicaments and biological substances status: Secondary | ICD-10-CM

## 2011-07-12 DIAGNOSIS — I2510 Atherosclerotic heart disease of native coronary artery without angina pectoris: Secondary | ICD-10-CM | POA: Diagnosis present

## 2011-07-12 LAB — CARDIAC PANEL(CRET KIN+CKTOT+MB+TROPI)
Relative Index: 3.7 — ABNORMAL HIGH (ref 0.0–2.5)
Total CK: 110 U/L (ref 7–177)
Troponin I: <0.3 ng/mL (ref <0.30)

## 2011-07-12 LAB — MAGNESIUM: Magnesium: 1.8 mg/dL (ref 1.5–2.5)

## 2011-07-13 LAB — GLUCOSE, CAPILLARY
Glucose-Capillary: 57 mg/dL — ABNORMAL LOW (ref 70–99)
Glucose-Capillary: 64 mg/dL — ABNORMAL LOW (ref 70–99)
Glucose-Capillary: 141 mg/dL — ABNORMAL HIGH (ref 70–99)
Glucose-Capillary: 131 mg/dL — ABNORMAL HIGH (ref 70–99)

## 2011-07-13 LAB — BASIC METABOLIC PANEL
Chloride: 109 mEq/L (ref 96–112)
GFR calc Af Amer: 38 mL/min — ABNORMAL LOW (ref >90)
GFR calc non Af Amer: 33 mL/min — ABNORMAL LOW (ref >90)
Potassium: 4 mEq/L (ref 3.5–5.1)
Sodium: 141 mEq/L (ref 135–145)

## 2011-07-13 LAB — CARDIAC PANEL(CRET KIN+CKTOT+MB+TROPI)
CK, MB: 3.2 ng/mL (ref 0.3–4.0)
Relative Index: 3.4 — ABNORMAL HIGH (ref 0.0–2.5)
Relative Index: 2.8 — ABNORMAL HIGH (ref 0.0–2.5)
Total CK: 117 U/L (ref 7–177)
Troponin I: <0.3 ng/mL (ref <0.30)

## 2011-07-14 LAB — BASIC METABOLIC PANEL
Calcium: 9.5 mg/dL (ref 8.4–10.5)
GFR calc Af Amer: 33 mL/min — ABNORMAL LOW (ref >90)
GFR calc non Af Amer: 28 mL/min — ABNORMAL LOW (ref >90)
Glucose, Bld: 134 mg/dL — ABNORMAL HIGH (ref 70–99)
Sodium: 140 mEq/L (ref 135–145)

## 2011-07-14 LAB — CBC
HCT: 34.3 % — ABNORMAL LOW (ref 36.0–46.0)
Hemoglobin: 10.7 g/dL — ABNORMAL LOW (ref 12.0–15.0)
MCH: 26.8 pg (ref 26.0–34.0)
MCHC: 31.2 g/dL (ref 30.0–36.0)
Platelets: 151 10*3/uL (ref 150–400)
RDW: 13.8 % (ref 11.5–15.5)

## 2011-07-14 LAB — GLUCOSE, CAPILLARY: Glucose-Capillary: 176 mg/dL — ABNORMAL HIGH (ref 70–99)

## 2011-08-06 NOTE — Discharge Summary (Signed)
Catherine Clayton, Catherine Clayton                ACCOUNT NO.:  [id number]  MEDICAL RECORD NO.:  [id number]  LOCATION:  2040                         FACILITY:  [facility]  PHYSICIAN:  Nanetta Batty, M.D.   DATE OF BIRTH:  April 30, 1925  DATE OF ADMISSION:  07/12/2011 DATE OF DISCHARGE:  07/15/2011                              DISCHARGE SUMMARY   DISCHARGE DIAGNOSES: 1. Nonsustained ventricular tachycardia with frequent PVCs. 2. Coronary artery disease status post coronary artery bypass grafting     in 2000. 3. Peripheral vascular disease. 4. Diabetes mellitus. 5. Hypertension. 6. Hyperlipidemia. 7. Ischemic cardiomyopathy, ejection fraction 40% to 45%.  HOSPITAL COURSE:  Catherine Clayton is an 77 year old Caucasian female with a history of coronary artery disease and underwent coronary bypass grafting in 2000, with SVG to the OM and LIMA to the LAD and SVG to the PDA and SVG to the D1.  She had a non ST elevation myocardial infarction in February 2010 with percutaneous coronary intervention with a Mini- Vision stent to the ramus intermediate.  SVG to the circumflex was occluded at the origin.  History also includes peripheral vascular obstructive disease status post PTA and stenting of the left SFA, hypertension, hyperlipidemia, diabetes mellitus, colon cancer.  Her last nuclear stress test was in March 2012, which showed a small area of scar without ischemia.  She presented to her family PCP on July 12, 2011, as she was feeling poorly, stated that her legs would get tight and then loosen up intermittently.  She does report some nausea, dizziness, and abdominal pain.  Upon presentation to her PCP, she was found to have intermittent V-tach, was first sent to [facility] and then to [facility].  EKG showed nonsustained ventricular tachycardia, premature ventricular contractions frequently.  The patient was admitted directly to the CCU. We monitored her heart rate and blood pressure, titrated medications. She was  initially started on IV amiodarone bolus and drip.  This was subsequently switched to p.o. upon presentation at [facility] and the amiodarone was then discontinued on July 13, 2011.  The patient had no further episodes of NSVT until July 14, 2011, where she had 3-beat run of nonsustained V-tach.  She also has a heart rate dropped down into the 50s.  On the 10th, she had no acute concerns and today, July 15, 2011, the patient is without complaints.  Heart rate and blood pressure is stable.  She does have frequent PVCs on her telemetry, otherwise feels well.  She has been seen by Dr. Tresa Endo who feels stable for discharge home.  We will increase her Coreg to 9.375 b.i.d. and see her in followup in the office.  DISCHARGE LABS:  WBC 6.0, hemoglobin 10.7, hematocrit 34.3, platelets 151.  Sodium 140, potassium 4.1, chloride 106, carbon dioxide 25, glucose was 172 today.  BUN 23, creatinine 1.60, calcium 9.5.  Cardiac enzymes were negative.  Troponin was negative x3.  There was a minimal increase in CK-MB of 4.1 upon presentation.  Magnesium upon presentation was 1.8.  She was started on magnesium.  STUDIES/PROCEDURES: 1. CT abdomen and pelvis showed colonic diverticula most notable     sigmoid colon with associated muscular hypertrophy and mild  thickening main aspect of the ascending colon without extraluminal     bowel inflammatory process.  Mild thickening of gastroesophageal     junction.  The etiology significant intermediate.  Atherosclerotic-     type changes of the aorta and branch vessels and coronary arteries.     The right external iliac stent is in place.  Cardiomegaly.  Slight     atrophic kidneys without hydronephrosis.  Slightly small lobulated     liver.  No other findings of cirrhosis.  Tiny calcified gallstones.     Degenerative changes in lower lumbar spine most notable at L4-L5     and L5-S1, degenerative changes of the hip joints. 2. 2D echocardiogram July 13, 2011,  showed left ventricular cavity     size was normal.  There was moderate concentric hypertrophy.     Systolic function was mildly-to-moderately reduced.  The ejection     fraction was 40% to 45%.  There was global hypokinesis with     regional variation.  Grade 1 diastolic dysfunction.  Aortic valve     is structurally normal valve.  Cusp separation was normal.  Aortic     root was normal size.  The ascending aorta was normal size.  Mitral     valve showed mildly thickened leaflets, atrial regurgitation.  Left     atrium is mildly dilated.  Atrial septum was poorly visualized.     Right ventricle cavity size is normal.  Wall thickness is normal.     Pulmonic valve appeared grossly normal.  Tricuspid valve     obstruction normal.  Right atrium was normal size.  No pericardial     effusion.  DISCHARGE MEDICATIONS: 1. Coreg 9.375 mg 1 tab by mouth twice daily. 2. Levothyroxine 25 mcg 1 tab by mouth daily before breakfast. 3. Magnesium oxide 400 mg 1 tab by mouth daily. 4. Nitroglycerin sublingual 0.4 mg 1 tablet under the tongue every 5     minutes 3 doses as needed for chest pain. 5. Potassium chloride 20 mEq 1 tab by mouth daily. 6. AcipHex 20 mg 1 tab by mouth twice daily. 7. Aspirin 325 mg one-half tab by mouth twice daily. 8. Atorvastatin 40 mg 1 tab by mouth every morning. 9. Calcium carbonate/vitamin-D over-the-counter 1 tab by mouth daily. 10.Fosamax 75 mg 1 tab by mouth weekly. 11.Humalog 15 units subcutaneously daily as needed. 12.Lantus 40 units subcutaneously every morning. 13.Lasix 20 mg 1 tab by mouth daily. 14.Ondansetron 4 mg 1 tab by mouth every 6 hours as needed for nausea     and vomiting. 15.Plavix 75 mg 1 tab by mouth daily. 16.Vitamin D 50,000 units 1 tab by mouth weekly on Wednesday. 17.Zolpidem 10 mg 1 tab by mouth daily at bedtime as needed for sleep.  DISPOSITION:  Catherine Clayton will be discharged home in stable condition. It is recommend she increase her  activity slowly.  It is recommend she eats a low-sodium, heart-healthy diet.  She will follow up with Dr. Allyson Sabal on Wednesday, August 04, 2011, at 10 a.m.    ______________________________ Wilburt Finlay, PA   ______________________________ Nanetta Batty, M.D.   BH/MEDQ  D:  07/15/2011  T:  07/15/2011  Job:  [id number]  cc:   Nanetta Batty, M.D.  Electronically Signed by Wilburt Finlay PA on 07/23/2011 02:13:51 PM Electronically Signed by Nanetta Batty M.D. on 08/06/2011 05:23:10 PM

## 2012-01-03 DIAGNOSIS — I639 Cerebral infarction, unspecified: Secondary | ICD-10-CM

## 2012-01-03 HISTORY — DX: Cerebral infarction, unspecified: I63.9

## 2012-01-23 ENCOUNTER — Encounter (HOSPITAL_COMMUNITY): Payer: Self-pay | Admitting: *Deleted

## 2012-01-23 ENCOUNTER — Inpatient Hospital Stay (HOSPITAL_COMMUNITY)
Admission: AD | Admit: 2012-01-23 | Discharge: 2012-01-25 | DRG: 066 | Disposition: A | Discharge status: Home Health | Payer: Medicare Other | Source: Other Acute Inpatient Hospital | Attending: Neurology | Admitting: Neurology

## 2012-01-23 DIAGNOSIS — M19029 Primary osteoarthritis, unspecified elbow: Secondary | ICD-10-CM | POA: Diagnosis present

## 2012-01-23 DIAGNOSIS — I252 Old myocardial infarction: Secondary | ICD-10-CM

## 2012-01-23 DIAGNOSIS — E039 Hypothyroidism, unspecified: Secondary | ICD-10-CM | POA: Diagnosis present

## 2012-01-23 DIAGNOSIS — I447 Left bundle-branch block, unspecified: Secondary | ICD-10-CM | POA: Diagnosis present

## 2012-01-23 DIAGNOSIS — Z8543 Personal history of malignant neoplasm of ovary: Secondary | ICD-10-CM

## 2012-01-23 DIAGNOSIS — Z85048 Personal history of other malignant neoplasm of rectum, rectosigmoid junction, and anus: Secondary | ICD-10-CM

## 2012-01-23 DIAGNOSIS — N289 Disorder of kidney and ureter, unspecified: Secondary | ICD-10-CM | POA: Diagnosis present

## 2012-01-23 DIAGNOSIS — Z951 Presence of aortocoronary bypass graft: Secondary | ICD-10-CM

## 2012-01-23 DIAGNOSIS — I480 Paroxysmal atrial fibrillation: Secondary | ICD-10-CM | POA: Diagnosis present

## 2012-01-23 DIAGNOSIS — E876 Hypokalemia: Secondary | ICD-10-CM | POA: Diagnosis present

## 2012-01-23 DIAGNOSIS — I639 Cerebral infarction, unspecified: Secondary | ICD-10-CM | POA: Diagnosis present

## 2012-01-23 DIAGNOSIS — I10 Essential (primary) hypertension: Secondary | ICD-10-CM | POA: Diagnosis present

## 2012-01-23 DIAGNOSIS — E785 Hyperlipidemia, unspecified: Secondary | ICD-10-CM | POA: Diagnosis present

## 2012-01-23 DIAGNOSIS — K219 Gastro-esophageal reflux disease without esophagitis: Secondary | ICD-10-CM | POA: Diagnosis present

## 2012-01-23 DIAGNOSIS — I619 Nontraumatic intracerebral hemorrhage, unspecified: Principal | ICD-10-CM | POA: Diagnosis present

## 2012-01-23 DIAGNOSIS — G2581 Restless legs syndrome: Secondary | ICD-10-CM | POA: Diagnosis present

## 2012-01-23 DIAGNOSIS — I2510 Atherosclerotic heart disease of native coronary artery without angina pectoris: Secondary | ICD-10-CM | POA: Diagnosis present

## 2012-01-23 DIAGNOSIS — Z8249 Family history of ischemic heart disease and other diseases of the circulatory system: Secondary | ICD-10-CM

## 2012-01-23 DIAGNOSIS — I739 Peripheral vascular disease, unspecified: Secondary | ICD-10-CM | POA: Diagnosis present

## 2012-01-23 HISTORY — DX: Hypothyroidism, unspecified: E03.9

## 2012-01-23 HISTORY — DX: Essential (primary) hypertension: I10

## 2012-01-23 HISTORY — DX: Hyperlipidemia, unspecified: E78.5

## 2012-01-23 HISTORY — DX: Malignant neoplasm of rectum: C20

## 2012-01-23 HISTORY — DX: Atherosclerotic heart disease of native coronary artery without angina pectoris: I25.10

## 2012-01-23 HISTORY — DX: Anxiety disorder, unspecified: F41.9

## 2012-01-23 HISTORY — DX: Malignant neoplasm of unspecified ovary: C56.9

## 2012-01-23 HISTORY — DX: Gastro-esophageal reflux disease without esophagitis: K21.9

## 2012-01-23 HISTORY — DX: Unspecified osteoarthritis, unspecified site: M19.90

## 2012-01-23 HISTORY — DX: Peripheral vascular disease, unspecified: I73.9

## 2012-01-23 LAB — COMPREHENSIVE METABOLIC PANEL
ALT: 16 U/L (ref 0–35)
Calcium: 8.9 mg/dL (ref 8.4–10.5)
Creatinine, Ser: 1.41 mg/dL — ABNORMAL HIGH (ref 0.50–1.10)
GFR calc Af Amer: 38 mL/min — ABNORMAL LOW (ref >90)
Glucose, Bld: 121 mg/dL — ABNORMAL HIGH (ref 70–99)
Sodium: 141 mEq/L (ref 135–145)
Total Protein: 6 g/dL (ref 6.0–8.3)

## 2012-01-23 LAB — CBC
Hemoglobin: 11.1 g/dL — ABNORMAL LOW (ref 12.0–15.0)
MCH: 29.1 pg (ref 26.0–34.0)
MCHC: 32.6 g/dL (ref 30.0–36.0)
Platelets: 140 10*3/uL — ABNORMAL LOW (ref 150–400)

## 2012-01-23 LAB — CARDIAC PANEL(CRET KIN+CKTOT+MB+TROPI): Total CK: 55 U/L (ref 7–177)

## 2012-01-23 LAB — GLUCOSE, CAPILLARY: Glucose-Capillary: 122 mg/dL — ABNORMAL HIGH (ref 70–99)

## 2012-01-23 LAB — MRSA PCR SCREENING: MRSA by PCR: NEGATIVE

## 2012-01-23 LAB — MAGNESIUM: Magnesium: 1.6 mg/dL (ref 1.5–2.5)

## 2012-01-23 MED ORDER — ONDANSETRON HCL 4 MG/2ML IJ SOLN
4.0000 mg | Freq: Four times a day (QID) | INTRAMUSCULAR | Status: DC | PRN
  Administered 2012-01-23 – 2012-01-25 (×2): 4 mg via INTRAVENOUS
  Filled 2012-01-23 (×3): qty 2

## 2012-01-23 MED ORDER — CARVEDILOL 6.25 MG PO TABS
6.2500 mg | ORAL_TABLET | Freq: Two times a day (BID) | ORAL | Status: DC
  Administered 2012-01-24: 6.25 mg via ORAL
  Filled 2012-01-23 (×3): qty 1

## 2012-01-23 MED ORDER — ROPINIROLE HCL 0.25 MG PO TABS
0.2500 mg | ORAL_TABLET | Freq: Every day | ORAL | Status: DC
  Administered 2012-01-23 – 2012-01-24 (×2): 0.25 mg via ORAL
  Filled 2012-01-23 (×3): qty 1

## 2012-01-23 MED ORDER — ONDANSETRON HCL 4 MG/2ML IJ SOLN
INTRAMUSCULAR | Status: AC
  Administered 2012-01-23: 4 mg via INTRAVENOUS
  Filled 2012-01-23: qty 2

## 2012-01-23 MED ORDER — INSULIN GLARGINE 100 UNIT/ML ~~LOC~~ SOLN
40.0000 [IU] | Freq: Every morning | SUBCUTANEOUS | Status: DC
  Administered 2012-01-24 – 2012-01-25 (×2): 40 [IU] via SUBCUTANEOUS

## 2012-01-23 MED ORDER — FUROSEMIDE 20 MG PO TABS
20.0000 mg | ORAL_TABLET | Freq: Every day | ORAL | Status: DC
  Administered 2012-01-23 – 2012-01-25 (×3): 20 mg via ORAL
  Filled 2012-01-23 (×4): qty 1

## 2012-01-23 MED ORDER — MAGNESIUM SULFATE 40 MG/ML IJ SOLN
2.0000 g | Freq: Once | INTRAMUSCULAR | Status: AC
  Administered 2012-01-23: 2 g via INTRAVENOUS
  Filled 2012-01-23: qty 50

## 2012-01-23 MED ORDER — POTASSIUM CHLORIDE 20 MEQ/15ML (10%) PO LIQD
ORAL | Status: AC
  Administered 2012-01-23: 40 meq
  Filled 2012-01-23: qty 30

## 2012-01-23 MED ORDER — POTASSIUM CHLORIDE 20 MEQ/15ML (10%) PO LIQD
40.0000 meq | Freq: Two times a day (BID) | ORAL | Status: DC
  Administered 2012-01-23: 40 meq via ORAL
  Filled 2012-01-23 (×3): qty 30

## 2012-01-23 MED ORDER — POTASSIUM CHLORIDE 20 MEQ PO PACK: 40.0000 meq | PACK | Freq: Once | ORAL | Status: DC

## 2012-01-23 MED ORDER — PANTOPRAZOLE SODIUM 40 MG PO TBEC
40.0000 mg | DELAYED_RELEASE_TABLET | Freq: Every day | ORAL | Status: DC
  Administered 2012-01-23: 40 mg via ORAL
  Filled 2012-01-23: qty 1

## 2012-01-23 MED ORDER — LEVOTHYROXINE SODIUM 25 MCG PO TABS
25.0000 ug | ORAL_TABLET | Freq: Every day | ORAL | Status: DC
  Administered 2012-01-24 – 2012-01-25 (×2): 25 ug via ORAL
  Filled 2012-01-23 (×3): qty 1

## 2012-01-23 MED ORDER — POTASSIUM CHLORIDE 20 MEQ/15ML (10%) PO LIQD: 20.0000 meq | Freq: Every day | ORAL | Status: DC

## 2012-01-23 MED ORDER — MAGNESIUM OXIDE 400 MG PO TABS
400.0000 mg | ORAL_TABLET | Freq: Every day | ORAL | Status: DC
  Administered 2012-01-24 – 2012-01-25 (×2): 400 mg via ORAL
  Filled 2012-01-23 (×3): qty 1

## 2012-01-23 MED ORDER — POTASSIUM CHLORIDE 20 MEQ/15ML (10%) PO LIQD
40.0000 meq | Freq: Once | ORAL | Status: DC
  Filled 2012-01-23: qty 30

## 2012-01-23 MED ORDER — INSULIN ASPART 100 UNIT/ML ~~LOC~~ SOLN: 0.0000 [IU] | Freq: Every day | SUBCUTANEOUS | Status: DC

## 2012-01-23 MED ORDER — INSULIN ASPART 100 UNIT/ML ~~LOC~~ SOLN
0.0000 [IU] | Freq: Three times a day (TID) | SUBCUTANEOUS | Status: DC
  Administered 2012-01-23 – 2012-01-25 (×6): 1 [IU] via SUBCUTANEOUS

## 2012-01-23 MED ORDER — LABETALOL HCL 5 MG/ML IV SOLN: 5.0000 mg | INTRAVENOUS | Status: DC | PRN

## 2012-01-23 MED ORDER — MAGNESIUM SULFATE IN D5W 10-5 MG/ML-% IV SOLN: 1.0000 g | Freq: Once | INTRAVENOUS | Status: DC

## 2012-01-23 MED ORDER — CARVEDILOL 3.125 MG PO TABS
3.1250 mg | ORAL_TABLET | Freq: Two times a day (BID) | ORAL | Status: DC
  Administered 2012-01-23: 3.125 mg via ORAL
  Filled 2012-01-23 (×2): qty 1

## 2012-01-23 MED ORDER — ATORVASTATIN CALCIUM 40 MG PO TABS
40.0000 mg | ORAL_TABLET | Freq: Every day | ORAL | Status: DC
  Administered 2012-01-23 – 2012-01-24 (×2): 40 mg via ORAL
  Filled 2012-01-23 (×3): qty 1

## 2012-01-23 NOTE — Consult Note (Signed)
Reason for Consult:  NSVT Referring Physician:   DIAVIAN FURGASON is an 78 y.o. female.  HPI:   The patient is a 78 year old female admitted with a small right cerebellar CVA.  She has a history of coronary artery disease status post bypass grafting x4 in 2000. She is known PVO D. Status post PTCA and stenting of her left SFA subsequent Dopplers have shown this to be occluded. She also has a history of hypertension, hyperlipidemia, diabetes atrial fibrillation, hypothyroidism, GERD, ovarian cancer, rectal cancer, arthritis, anxiety.  The patient had a non-ST segment elevation myocardial infarction February 2010 catheterization at that time with field an occluded OM graft, patent LIMA to the LAD, patent vein to the diagonal branch of the distal right coronary artery. Ejection fraction at that time was 55%. She did have a ramus graft stenosis which was intervened on by Dr. Jacinto Halim several months later.  Patient had a [place] angiogram 01/26/2011 confirmed occlusion of the right common femoral artery which was subsequently recanalized and stented by Dr. Allyson Sabal.  Her ABI increase 0.49-0.9 and her claudication resolved. Patient was admitted to [facility] 07/22/2011 with nonsustained ventricular tachycardia at that time she was found to be hypokalemic hypomagnesemic these were repleted 2-D echocardiogram showed an ejection fraction of 40-45% with mild global hypokinesis the followup Myoview showed nonischemic.    We're asked to see the patient for a nonsustained ventricular tachycardia.  Her magnesium is borderline normal and she is hypokalemic.patient reports that last week she was [employer] [place] had become decreased and she couldn't walk well. In last night she was at the [place] having her 87th birthday party she states that she started shaking very badly particularly in her arm and hands. 500 hours this morning she states that she could hardly walk she couldn't get to the bathroom. She is also complaining of  some chest achiness/chest pain some pain on the left side of her torso in her shoulder blades. She says she was very jerky she was diaphoretic with nausea and shortness of breath. She really went to [facility] at 0600 and was transferred here. Complains of some abdominal jerkiness. He denies palpitations vomiting or extremity edema abdominal pain cough congestion dizziness, lower extremity edema. She does status just very dark black stool as weak also indicates she takes Pepto-Bismol frequently for loose stools.  Past Medical History  Diagnosis Date  . Hypertension   . Coronary artery disease     Cabg x 4  . Myocardial infarction   . Angina   . Atrial fibrillation   . Diabetes mellitus     insulin dependent  . Shortness of breath   . Hypothyroidism   . GERD (gastroesophageal reflux disease)   . Ovarian cancer   . Rectal cancer   . Arthritis     Bilateral arms  . Anxiety   . Hyperlipemia     Past Surgical History  Procedure Date  . Coronary artery bypass graft     x 4 1995  . Abdominal hysterectomy   . Tonsillectomy   . Appendectomy   . Back surgery     thoracic "cementing"    Family History  Problem Relation Age of Onset  . Breast cancer Sister   . Coronary artery disease Sister   . Alzheimer's disease Sister   . Stroke Brother   . Stroke Sister   . Hypertension Son     Social History:  reports that she has never smoked. She has never used smokeless  tobacco. She reports that she does not drink alcohol or use illicit drugs.  Allergies:  Allergies  Allergen Reactions  . Influenza Vaccines Swelling  . Demerol Nausea And Vomiting  . Promethazine Other (See Comments)    Pt. Unsure  . Zithromax (Azithromycin) Nausea And Vomiting  . Penicillins Rash    Medications: insulin, levothyroxine 25 mcg, aspirin 325, furosemide 20 mg daily, Plavix 75 mg daily, AcipHex, carvedilol 6.25 mg twice daily, atorvastatin 40 mg daily, potassium 20 mEq daily, magnesium oxide 400  mg daily.  Results for orders placed during the hospital encounter of 01/23/12 (from the past 48 hour(s))  GLUCOSE, CAPILLARY     Status: Abnormal   Collection Time   01/23/12 12:08 PM      Component Value Range Comment   Glucose-Capillary 122 (*) 70 - 99 (mg/dL)   CBC     Status: Abnormal   Collection Time   01/23/12 12:18 PM      Component Value Range Comment   WBC 5.1  4.0 - 10.5 (K/uL)    RBC 3.81 (*) 3.87 - 5.11 (MIL/uL)    Hemoglobin 11.1 (*) 12.0 - 15.0 (g/dL)    HCT 04.5 (*) 40.9 - 46.0 (%)    MCV 89.5  78.0 - 100.0 (fL)    MCH 29.1  26.0 - 34.0 (pg)    MCHC 32.6  30.0 - 36.0 (g/dL)    RDW 81.1  91.4 - 78.2 (%)    Platelets 140 (*) 150 - 400 (K/uL)   COMPREHENSIVE METABOLIC PANEL     Status: Abnormal   Collection Time   01/23/12 12:18 PM      Component Value Range Comment   Sodium 141  135 - 145 (mEq/L)    Potassium 3.1 (*) 3.5 - 5.1 (mEq/L)    Chloride 108  96 - 112 (mEq/L)    CO2 21  19 - 32 (mEq/L)    Glucose, Bld 121 (*) 70 - 99 (mg/dL)    BUN 20  6 - 23 (mg/dL)    Creatinine, Ser 9.56 (*) 0.50 - 1.10 (mg/dL)    Calcium 8.9  8.4 - 10.5 (mg/dL)    Total Protein 6.0  6.0 - 8.3 (g/dL)    Albumin 3.0 (*) 3.5 - 5.2 (g/dL)    AST 22  0 - 37 (U/L)    ALT 16  0 - 35 (U/L)    Alkaline Phosphatase 160 (*) 39 - 117 (U/L)    Total Bilirubin 0.2 (*) 0.3 - 1.2 (mg/dL)    GFR calc non Af Amer 33 (*) >90 (mL/min)    GFR calc Af Amer 38 (*) >90 (mL/min)   MAGNESIUM     Status: Normal   Collection Time   01/23/12 12:18 PM      Component Value Range Comment   Magnesium 1.6  1.5 - 2.5 (mg/dL)     No results found.  Review of Systems  Constitutional: Positive for diaphoresis. Negative for fever.  HENT: Negative for congestion.   Eyes: Negative for blurred vision.  Respiratory: Positive for shortness of breath. Negative for cough.   Cardiovascular: Positive for chest pain. Negative for palpitations, orthopnea and leg swelling.  Gastrointestinal: Positive for nausea. Negative  for vomiting, abdominal pain, diarrhea, constipation, blood in stool and melena.  Genitourinary: Negative for dysuria and hematuria.  Musculoskeletal: Positive for myalgias ( pain and left left shoulder blade).  Neurological: Positive for tremors. Negative for dizziness and weakness.   Blood pressure 149/77, pulse 86,  temperature 98.3 F (36.8 C), temperature source Oral, resp. rate 17, height 5\' 6"  (1.676 m), weight 66.9 kg (147 lb 7.8 oz), SpO2 99.00%. Physical Exam  Constitutional: She is oriented to person, place, and time. No distress.       Appears her stated age  HENT:  Head: Normocephalic and atraumatic.  Eyes: EOM are normal. Pupils are equal, round, and reactive to light. No scleral icterus.  Neck: Normal range of motion. Neck supple. No JVD present.  Cardiovascular: Normal rate and regular rhythm.   No murmur heard. Respiratory: Effort normal and breath sounds normal. She has no wheezes. She has no rales.  GI: Soft. Bowel sounds are normal. There is no tenderness.  Musculoskeletal: She exhibits no edema.  Lymphadenopathy:    She has no cervical adenopathy.  Neurological: She is alert and oriented to person, place, and time. She exhibits normal muscle tone.  Skin: Skin is warm and dry.  Psychiatric: She has a normal mood and affect.    Assessment/Plan: Patient Active Hospital Problem List: 1.  Right cerebellar CVA 2.  NVST, 33 Beat this admission. History of NSVT previous admission in October 3. Coronary artery disease 4. Coronary artery bypass grafting x4 in 2000 5. history of hypertension 6 peripheral artery disease 7 history of colon cancer 8 history of ovarian cancer 9 diabetes mellitus 10 hyperlipidemia  Plan:  Telemetry shows a multiple ectopy.  Check an EKG. We'll cycle cardiac enzymes as patient has been complaining of chest pain prior to admission. I will increase her Coreg to home dose of 6.25 mg twice daily. Plavix has been discontinued due to cerebellar  CVA.  Potassium is being repleted and the patient is being given magnesium due to low normal value.  We'll add SCDs for DVT prophylaxis.  HAGER,BRYAN W 01/23/2012, 3:54 PM    Patient seen and examined. Agree with assessment and plan.  Pt currently feels well.  No CP or SOB. Telemetry now shows sinus rhythm with occ PVC's, but earlier [facility] noted for 33 beats wild very mild irregularity suggesting possible PAF with aberrancy vc VT.  Will titrate beta-blocker, electrolyte replete. Pt is 15 yrs s/p CABG with last cath 11/2008.  Suspect will need evaluation for ischema.   Lennette Bihari, MD, [facility] 01/23/2012 8:45 PM

## 2012-01-23 NOTE — Progress Notes (Signed)
PT Cancellation Note  Treatment cancelled today due to medical issues with patient which prohibited therapy. Spoke with RN, pt had 33 beats of v-tach. Will attempt evaluation tomorrow pending medical stability as well as increased activity orders.  Thanks, 01/23/2012 Milana Kidney DPT PAGER: [phone] OFFICE: [phone]    Milana Kidney 01/23/2012, 2:58 PM

## 2012-01-23 NOTE — Progress Notes (Signed)
Notified MD re: Pt had 33 beats of VT. BP 148/79. Pt asymptomatic. States didn't did not feel it. 01/23/2012 2:20 PM Orders Received: Barbera Setters

## 2012-01-23 NOTE — H&P (Addendum)
Admission H&P    Chief Complaint: "nausea, shaking all over, cerebellar stroke on CT head"  HPI: Catherine Clayton is an 78 y.o. female who had nausea and generalized shaking this last night and was taken to ED at another facility where she was found to have a small right cerebellar stroke. Patient reports no other symptoms. She also complains of long-standing urge to move her legs around a lot and especially closer to the night.  LSN: 2 am tPA Given: No: hemorrhage  mRankin: 0  Past Medical History  Diagnosis Date  . Hypertension   . Coronary artery disease   . Myocardial infarction   . Angina   . Atrial fibrillation   . Diabetes mellitus     insulin dependent  . Shortness of breath   . Hypothyroidism   . GERD (gastroesophageal reflux disease)   . Ovarian cancer   . Rectal cancer   . Arthritis     Bilateral arms  . Anxiety    Past Surgical History  Procedure Date  . Coronary artery bypass graft     x 4 1995  . Abdominal hysterectomy   . Tonsillectomy   . Appendectomy   . Back surgery     thoracic "cementing"   Family History  Problem Relation Age of Onset  . Breast cancer Sister   . Coronary artery disease Sister   . Alzheimer's disease Sister   . Stroke Brother   . Stroke Sister   . Hypertension Son    Social History:  reports that she has never smoked. She has never used smokeless tobacco. She reports that she does not drink alcohol or use illicit drugs.  Allergies:  Allergies  Allergen Reactions  . Demerol Nausea And Vomiting  . Promethazine   . Zithromax (Azithromycin) Nausea And Vomiting  . Penicillins Rash   Medications Prior to Admission  Medication Dose Route Frequency Provider Last Rate Last Dose  . labetalol (NORMODYNE,TRANDATE) injection 5 mg  5 mg Intravenous Q2H PRN Carmell Austria, MD       No current outpatient prescriptions on file as of 01/23/2012.   ROS: as above  Physical Examination: Pulse 86, temperature 98.4 F (36.9 C), temperature  source Oral, resp. rate 18, height 5\' 6"  (1.676 m), weight 66.9 kg (147 lb 7.8 oz), SpO2 100.00%.  Neurologic Examination: MS: AAO*3, no aphasia, follows complex commands CN: EOMI, PERRL, VFF, right facial droop - possibly due to missing teeth on that side, tongue midline, V1-V3 sensation is intact b/l Motor: no drift, strength 5/5 throughout Sensory: no deficits to LT Coord: F to N intact b/l Reflexes: 1+throughout, plantars mute b/l Gait: deferred  Assessment:  78 y.o. female with small right cerebellar CVA who presented as nausea - current BP is 168/84  Plan: 1) Restart home meds 2) Goal SBP < 140 3) Will keep in ICU overnight and attempt to lower her blood pressure. If home dose of Coreg does not work, will add more Coreg. Likely can move out of the unit in am 4) Started Requip for RLS - can be titrated up down the line 5) Iron studies, CBC, CMP  6) Gave 2 G of Mg for borderline low normal Mg and plan to give 40 meq of oral potassium now and 40 meq later today - will give 80 meq more total tomorrow. Check Mg and potassium levels in am. Plan to restart patient's usual potassium dose on Tuesday should there be no incidents.  7) Dr. Allyson Sabal who is  the patient's cardiologist will consult   LOS: 0 days   Parks Czajkowski

## 2012-01-24 ENCOUNTER — Inpatient Hospital Stay (HOSPITAL_COMMUNITY): Payer: Medicare Other

## 2012-01-24 ENCOUNTER — Encounter (HOSPITAL_COMMUNITY): Payer: Self-pay | Admitting: *Deleted

## 2012-01-24 LAB — GLUCOSE, CAPILLARY
Glucose-Capillary: 121 mg/dL — ABNORMAL HIGH (ref 70–99)
Glucose-Capillary: 143 mg/dL — ABNORMAL HIGH (ref 70–99)
Glucose-Capillary: 164 mg/dL — ABNORMAL HIGH (ref 70–99)

## 2012-01-24 LAB — IRON AND TIBC
Iron: 44 ug/dL (ref 42–135)
Saturation Ratios: 15 % — ABNORMAL LOW (ref 20–55)
TIBC: 287 ug/dL (ref 250–470)

## 2012-01-24 LAB — CARDIAC PANEL(CRET KIN+CKTOT+MB+TROPI)
CK, MB: 2.2 ng/mL (ref 0.3–4.0)
Relative Index: INVALID (ref 0.0–2.5)
Total CK: 54 U/L (ref 7–177)
Troponin I: <0.3 ng/mL (ref <0.30)
Troponin I: <0.3 ng/mL (ref <0.30)

## 2012-01-24 LAB — POTASSIUM: Potassium: 4.5 mEq/L (ref 3.5–5.1)

## 2012-01-24 LAB — HEMOGLOBIN A1C: Hgb A1c MFr Bld: 6.9 % — ABNORMAL HIGH (ref <5.7)

## 2012-01-24 MED ORDER — POTASSIUM CHLORIDE CRYS ER 20 MEQ PO TBCR
40.0000 meq | EXTENDED_RELEASE_TABLET | Freq: Two times a day (BID) | ORAL | Status: DC
  Administered 2012-01-24 (×2): 40 meq via ORAL
  Filled 2012-01-24 (×2): qty 2

## 2012-01-24 MED ORDER — CARVEDILOL 3.125 MG PO TABS
3.1250 mg | ORAL_TABLET | Freq: Two times a day (BID) | ORAL | Status: DC
  Administered 2012-01-24 – 2012-01-25 (×2): 3.125 mg via ORAL
  Filled 2012-01-24 (×4): qty 1

## 2012-01-24 MED ORDER — AMLODIPINE BESYLATE 5 MG PO TABS
5.0000 mg | ORAL_TABLET | Freq: Every day | ORAL | Status: DC
  Administered 2012-01-24 – 2012-01-25 (×2): 5 mg via ORAL
  Filled 2012-01-24 (×2): qty 1

## 2012-01-24 MED ORDER — PANTOPRAZOLE SODIUM 40 MG PO TBEC
40.0000 mg | DELAYED_RELEASE_TABLET | Freq: Every day | ORAL | Status: DC
  Administered 2012-01-24: 40 mg via ORAL
  Filled 2012-01-24 (×2): qty 1

## 2012-01-24 MED ORDER — CALCIUM CARBONATE-VITAMIN D 500-200 MG-UNIT PO TABS
1.0000 | ORAL_TABLET | Freq: Every day | ORAL | Status: DC
  Administered 2012-01-24 – 2012-01-25 (×2): 1 via ORAL
  Filled 2012-01-24 (×2): qty 1

## 2012-01-24 MED ORDER — ATORVASTATIN CALCIUM 40 MG PO TABS: 40.0000 mg | ORAL_TABLET | Freq: Every day | ORAL | Status: DC

## 2012-01-24 MED ORDER — INSULIN GLARGINE 100 UNIT/ML ~~LOC~~ SOLN: 40.0000 [IU] | Freq: Every day | SUBCUTANEOUS | Status: DC

## 2012-01-24 MED ORDER — FUROSEMIDE 20 MG PO TABS: 20.0000 mg | ORAL_TABLET | Freq: Every day | ORAL | Status: DC

## 2012-01-24 MED ORDER — AMLODIPINE 1 MG/ML ORAL SUSPENSION: 5.0000 mg | Freq: Every day | ORAL | Status: DC

## 2012-01-24 MED ORDER — LEVOTHYROXINE SODIUM 25 MCG PO TABS: 25.0000 ug | ORAL_TABLET | Freq: Every day | ORAL | Status: DC

## 2012-01-24 MED ORDER — POTASSIUM CHLORIDE CRYS ER 20 MEQ PO TBCR
20.0000 meq | EXTENDED_RELEASE_TABLET | Freq: Every day | ORAL | Status: DC
  Administered 2012-01-25: 20 meq via ORAL
  Filled 2012-01-24: qty 1

## 2012-01-24 MED ORDER — POTASSIUM CHLORIDE CRYS ER 20 MEQ PO TBCR
EXTENDED_RELEASE_TABLET | ORAL | Status: AC
  Administered 2012-01-24: 40 meq
  Filled 2012-01-24: qty 2

## 2012-01-24 MED ORDER — PANTOPRAZOLE SODIUM 40 MG PO TBEC: 40.0000 mg | DELAYED_RELEASE_TABLET | Freq: Every day | ORAL | Status: DC

## 2012-01-24 NOTE — Progress Notes (Signed)
Stroke Team Progress Note  HISTORY Catherine Clayton is an 78 y.o. female who had nausea and generalized shaking and was taken to [facility] ED 01/23/2012 at 0543  where she was found to have a small right cerebellar hemorrhage. BP on arrival there was 182/86. Patient reports no other symptoms. She also complains of long-standing urge to move her legs around a lot and especially closer to the night. Patient was not a TPA candidate secondary to hemorrhage. She was admitted to the neuro ICU for further evaluation and treatment.  SUBJECTIVE Her daughter is at the bedside.  Overall she feels her condition is stable. She turns 87 tomorrow. She just had a big birthday party this weekend. She is independent and lives alone prior to adm. Taken off lisinopril by Dr. Allyson Sabal recently. She has history of hypertension but recently was not on any medicines.  OBJECTIVE Most recent Vital Signs: Filed Vitals:   01/23/12 1900 01/23/12 1924 01/24/12 0400 01/24/12 0700  BP: 141/67   148/61  Pulse: 69   58  Temp:  98.8 F (37.1 C) 98.6 F (37 C) 99.2 F (37.3 C)  TempSrc:  Oral Oral Oral  Resp: 18  18 16   Height:      Weight:      SpO2: 100%   97%   CBG (last 3)  Basename 01/23/12 2319 01/23/12 1657 01/23/12 1208  GLUCAP 183* 149* 122*   Intake/Output from previous day: 04/21 0701 - 04/22 0700 In: 290 [P.O.:240; IV Piggyback:50] Out: 1000 [Urine:1000]  IV Fluid Intake:     MEDICATIONS    . atorvastatin  40 mg Oral q1800  . carvedilol  6.25 mg Oral BID WC  . furosemide  20 mg Oral Daily  . insulin aspart  0-5 Units Subcutaneous QHS  . insulin aspart  0-9 Units Subcutaneous TID WC  . insulin glargine  40 Units Subcutaneous q morning - 10a  . levothyroxine  25 mcg Oral QAC breakfast  . magnesium oxide  400 mg Oral Daily  . magnesium sulfate 1 - 4 g bolus IVPB  2 g Intravenous Once  . ondansetron      . pantoprazole  40 mg Oral Q1200  . potassium chloride  20 mEq Oral Daily  . potassium chloride  40  mEq Oral BID  . potassium chloride  40 mEq Oral Once  . potassium chloride      . rOPINIRole  0.25 mg Oral QHS  . DISCONTD: carvedilol  3.125 mg Oral BID WC  . DISCONTD: magnesium sulfate 1 - 4 g bolus IVPB  1 g Intravenous Once  . DISCONTD: potassium chloride  40 mEq Oral Once   PRN:  labetalol, ondansetron (ZOFRAN) IV  Diet:  Carb Control thin liquids Activity:  Bedrest DVT Prophylaxis:  SCDs   CLINICALLY SIGNIFICANT STUDIES CBC    Component Value Date/Time   WBC 5.1 01/23/2012 1218   RBC 3.81* 01/23/2012 1218   HGB 11.1* 01/23/2012 1218   HCT 34.1* 01/23/2012 1218   PLT 140* 01/23/2012 1218   MCV 89.5 01/23/2012 1218   MCH 29.1 01/23/2012 1218   MCHC 32.6 01/23/2012 1218   RDW 13.9 01/23/2012 1218   LYMPHSABS 2.2 11/22/2008 1830   MONOABS 0.5 11/22/2008 1830   EOSABS 0.2 11/22/2008 1830   BASOSABS 0.0 11/22/2008 1830   CMP    Component Value Date/Time   NA 141 01/23/2012 1218   K 4.5 01/24/2012 0335   CL 108 01/23/2012 1218   CO2 21 01/23/2012  1218   GLUCOSE 121* 01/23/2012 1218   BUN 20 01/23/2012 1218   CREATININE 1.41* 01/23/2012 1218   CALCIUM 8.9 01/23/2012 1218   PROT 6.0 01/23/2012 1218   ALBUMIN 3.0* 01/23/2012 1218   AST 22 01/23/2012 1218   ALT 16 01/23/2012 1218   ALKPHOS 160* 01/23/2012 1218   BILITOT 0.2* 01/23/2012 1218   GFRNONAA 33* 01/23/2012 1218   GFRAA 38* 01/23/2012 1218   COAGS Lab Results  Component Value Date   INR 0.95 01/25/2011   INR 1.1 11/27/2008   INR 1.0 11/25/2008   Lipid Panel    Component Value Date/Time   CHOL  Value: 113        ATP III CLASSIFICATION:  <200     mg/dL   Desirable  161-096  mg/dL   Borderline High  >=045    mg/dL   High        01/10/8118 0145   TRIG 66 11/23/2008 0145   HDL 34* 11/23/2008 0145   CHOLHDL 3.3 11/23/2008 0145   VLDL 13 11/23/2008 0145   LDLCALC  Value: 66        Total Cholesterol/HDL:CHD Risk Coronary Heart Disease Risk Table                     Men   Women  1/2 Average Risk   3.4   3.3  Average Risk       5.0   4.4  2 X  Average Risk   9.6   7.1  3 X Average Risk  23.4   11.0        Use the calculated Patient Ratio above and the CHD Risk Table to determine the patient's CHD Risk.        ATP III CLASSIFICATION (LDL):  <100     mg/dL   Optimal  147-829  mg/dL   Near or Above                    Optimal  130-159  mg/dL   Borderline  562-130  mg/dL   High  >865     mg/dL   Very High 7/84/6962 9528   HgbA1C  Lab Results  Component Value Date   HGBA1C  Value: 7.6 (NOTE)   The ADA recommends the following therapeutic goal for glycemic   control related to Hgb A1C measurement:   Goal of Therapy:   < 7.0% Hgb A1C   Reference: American Diabetes Association: Clinical Practice   Recommendations 2008, Diabetes Care,  2008, 31:(Suppl 1).* 11/22/2008   Cardiac Panel (last 3 results)  Basename 01/24/12 0012 01/23/12 1806  CKTOTAL 54 55  CKMB 2.6 2.8  TROPONINI <0.30 <0.30  RELINDX RELATIVE INDEX IS INVALID RELATIVE INDEX IS INVALID   Urinalysis No results found for this basename: colorurine, appearanceur, labspec, phurine, glucoseu, hgbur, bilirubinur, ketonesur, proteinur, urobilinogen, nitrite, leukocytesur   Urine Drug Screen  No results found for this basename: labopia, cocainscrnur, labbenz, amphetmu, thcu, labbarb    Alcohol Level No results found for this basename: eth    MRI of the brain  pending  MRA of the brain  pending  2D Echocardiogram  pending  Carotid Doppler  pending     Physical Exam    Pleasant elderly Caucasian lady currently not in distress.Awake alert. Afebrile. Head is nontraumatic. Neck is supple without bruit. Hearing is normal. Cardiac exam no murmur or gallop. Lungs are clear to auscultation. Distal pulses are well felt.  Neurological  Exam :Awake  Alert oriented x 3. Normal speech and language.eye movements full without nystagmus. Face symmetric. Tongue midline. Normal strength, tone, reflexes and coordination. Normal sensation. Gait deferred. Mild action tremors both upper and lower  extremities and absent at rest.  ASSESSMENT Catherine Clayton is a 78 y.o. female with a small 6 mm cerebellar hemorrhage secondary to hypertension. Doubt underlying lesion like metastasis or,amyloid angiopathy  -Hypertension -Coronary artery disease  -Myocardial infarction  -Atrial fibrillation  -Diabetes mellitus  -Hypothyroidism  -GERD (gastroesophageal reflux disease)  -Ovarian cancer  -Rectal cancer  -Arthritis  -Anxiety  -renal insufficiency, Cr. 1.41 (?reason why lisinopril was stopped. Daughter unsure)  Hospital day # 1  TREATMENT/PLAN -Add norvasc for BP control -MRI/MRA -keep in ICU tonight -Do not place PICC  SHARON BIBY, AVNP, ANP-BC, GNP-BC [facility] Pager: [phone] 01/24/2012 7:59 AM  Dr. Delia Heady, Stroke Center Medical Director, has personally reviewed chart, pertinent data, examined the patient and developed the plan of care.

## 2012-01-24 NOTE — Evaluation (Signed)
Physical Therapy Evaluation Patient Details Name: Catherine Clayton MRN: [id number] DOB: 05-Mar-1925 Today's Date: 01/24/2012 Time: 1000-1032 PT Time Calculation (min): 32 min  PT Assessment / Plan / Recommendation Clinical Impression  Pt is a 78 y.o. female admitted s/p a small R cerebellar stroke with resulting decreased mobility, impaired balance, and impaired gait.  Pt will benefit from skilled physical therapy in the acute setting on order to address these impairments and prepare for d/c home with home-health PT and initial 24 hour supervision.    PT Assessment  Patient needs continued PT services    Follow Up Recommendations  Home health PT;Supervision/Assistance - 24 hour (24 hour superivision initially)    Equipment Recommendations  None recommended by PT    Frequency Min 4X/week    Precautions / Restrictions Precautions Precautions: Fall Restrictions Weight Bearing Restrictions: No   Pertinent Vitals/Pain       Mobility  Bed Mobility Bed Mobility: Sit to Supine Sit to Supine: 5: Supervision Details for Bed Mobility Assistance: Pt required supervision for safety. Transfers Transfers: Sit to Stand;Stand to Sit Sit to Stand: 4: Min guard;From chair/3-in-1;With upper extremity assist;With armrests Stand to Sit: 4: Min guard;To bed;With upper extremity assist Details for Transfer Assistance: Pt required min guard assist for safety due to initial unsteadiness upon standing. Ambulation/Gait Ambulation/Gait Assistance: 4: Min guard Ambulation Distance (Feet): 30 Feet Assistive device: None Ambulation/Gait Assistance Details: Pt required min guard assist for safety. Gait Pattern: Step-through pattern;Decreased stride length Gait velocity: Decreased Stairs: No Modified Rankin (Stroke Patients Only) Modified Rankin: Moderately severe disability    Exercises     PT Goals Acute Rehab PT Goals PT Goal Formulation: With patient/family Time For Goal Achievement:  01/31/12 Potential to Achieve Goals: Good Pt will go Supine/Side to Sit: Independently PT Goal: Supine/Side to Sit - Progress: Goal set today Pt will go Sit to Supine/Side: Independently PT Goal: Sit to Supine/Side - Progress: Goal set today Pt will go Sit to Stand: with modified independence;with upper extremity assist PT Goal: Sit to Stand - Progress: Goal set today Pt will go Stand to Sit: with modified independence;with upper extremity assist PT Goal: Stand to Sit - Progress: Goal set today Pt will Ambulate: >150 feet;with modified independence PT Goal: Ambulate - Progress: Goal set today  Visit Information  Last PT Received On: 01/24/12 Assistance Needed: +1 PT/OT Co-Evaluation/Treatment: Yes    Subjective Data      Prior Functioning  Home Living Lives With: Alone Available Help at Discharge: Family Type of Home: Other (Comment) (Townhouse) Home Access: Level entry Home Layout: One level Bathroom Shower/Tub: Engineer, manufacturing systems: Standard Home Adaptive Equipment: Hand-held shower hose;Quad cane;Straight cane Prior Function Level of Independence: Independent Able to Take Stairs?: Yes Driving: No Comments: Pt able to perform all all self-care independently.  Patient's daughter gets her groceries weekly and family bring her cooked meals, but she can put together food (i.e. sandwiches, cereal) Communication Communication: No difficulties    Cognition  Overall Cognitive Status: Appears within functional limits for tasks assessed/performed Arousal/Alertness: Awake/alert Orientation Level: Oriented X4 / Intact Behavior During Session: [facility] for tasks performed    Extremity/Trunk Assessment Right Lower Extremity Assessment RLE ROM/Strength/Tone: Within functional levels RLE Sensation: WFL - Light Touch;WFL - Proprioception RLE Coordination: WFL - gross/fine motor Left Lower Extremity Assessment LLE ROM/Strength/Tone: Within functional levels LLE Sensation: WFL  - Light Touch;WFL - Proprioception LLE Coordination: WFL - gross/fine motor Trunk Assessment Trunk Assessment: Normal   Balance Balance Balance Assessed:  Yes Static Sitting Balance Static Sitting - Balance Support: Bilateral upper extremity supported;Feet supported Static Sitting - Level of Assistance: 5: Stand by assistance Static Sitting - Comment/# of Minutes: 5 minutes Static Standing Balance Static Standing - Balance Support: During functional activity;No upper extremity supported Static Standing - Level of Assistance: Other (comment) (Min guard) Static Standing - Comment/# of Minutes: Pt required min guard assist for safety while brushing her teeth at the sink.  End of Session PT - End of Session Equipment Utilized During Treatment: Gait belt Activity Tolerance: Patient tolerated treatment well Patient left: in bed;with call bell/phone within reach;with family/visitor present   Ezzard Standing SPT 01/24/2012, 12:07 PM

## 2012-01-24 NOTE — Evaluation (Signed)
Occupational Therapy Evaluation Patient Details Name: Catherine Clayton MRN: [id number] DOB: August 06, 1925 Today's Date: 01/24/2012 Time: 9629-5284 OT Time Calculation (min): 31 min  OT Assessment / Plan / Recommendation Clinical Impression  Pt admitted with small R cerebellar CVA with decreased balance and tremor interfering with independence in ADL.  Will follow acutely.  Do not anticipate further OT upon d/c.     OT Assessment  Patient needs continued OT Services    Follow Up Recommendations  Supervision/Assistance - 24 hour    Equipment Recommendations  None recommended by OT    Frequency Min 2X/week    Precautions / Restrictions Precautions Precautions: Fall Restrictions Weight Bearing Restrictions: No   Pertinent Vitals/Pain NA    ADL  Eating/Feeding: Simulated;Moderate assistance (tremor increasing spillage) Where Assessed - Eating/Feeding: Chair Grooming: Performed;Teeth care;Minimal assistance Where Assessed - Grooming: Standing at sink Upper Body Bathing: Simulated;Supervision/safety Where Assessed - Upper Body Bathing: Sitting, chair Lower Body Bathing: Simulated;Supervision/safety Where Assessed - Lower Body Bathing: Sitting, chair;Sit to stand from chair Upper Body Dressing: Simulated;Supervision/safety Where Assessed - Upper Body Dressing: Sitting, chair Lower Body Dressing: Performed;Supervision/safety Where Assessed - Lower Body Dressing: Sitting, chair;Sit to stand from chair Toilet Transfer: Simulated;Minimal assistance;Other (comment) (min guard) Toilet Transfer Method: Ambulating Equipment Used: Gait belt Ambulation Related to ADLs: min guard assist for ambulation in room ADL Comments: Pt's daughter reports pt having a slight tremor prior to this event that has worsened.    OT Goals Acute Rehab OT Goals OT Goal Formulation: With patient Time For Goal Achievement: 01/31/12 Potential to Achieve Goals: Good ADL Goals Pt Will Perform Eating: with  modified independence;Sitting, chair;with adaptive utensils;Other (comment) (adaptive techniques for tremor) ADL Goal: Eating - Progress: Goal set today Pt Will Transfer to Toilet: with supervision;Regular height toilet ADL Goal: Toilet Transfer - Progress: Goal set today Pt Will Perform Tub/Shower Transfer: Shower transfer;with supervision;Ambulation ADL Goal: Tub/Shower Transfer - Progress: Goal set today  Visit Information  Last OT Received On: 01/24/12 Assistance Needed: +1 PT/OT Co-Evaluation/Treatment: Yes    Subjective Data  Subjective: "Let me tell you about my birthday party I just had." Patient Stated Goal: Return to PLOF.   Prior Functioning  Home Living Lives With: Alone Available Help at Discharge: Family Type of Home: Other (Comment) Home Access: Level entry Home Layout: One level Bathroom Shower/Tub: Engineer, manufacturing systems: Standard Home Adaptive Equipment: Hand-held shower hose;Quad cane;Straight cane Prior Function Level of Independence: Independent Able to Take Stairs?: Yes Driving: No Comments: Pt able to perform all all self-care independently.  Patient's daughter gets her groceries weekly and family bring her cooked meals, but she can put together food (i.e. sandwiches, cereal) Communication Communication: No difficulties Dominant Hand: Right    Cognition  Overall Cognitive Status: Appears within functional limits for tasks assessed/performed Arousal/Alertness: Awake/alert Orientation Level: Oriented X4 / Intact Behavior During Session: [facility] for tasks performed    Extremity/Trunk Assessment Right Upper Extremity Assessment RUE ROM/Strength/Tone:  (tremor) RUE Sensation: WFL - Light Touch;WFL - Proprioception RUE Coordination: WFL - gross/fine motor;WFL - gross motor Left Upper Extremity Assessment LUE ROM/Strength/Tone: Deficits (tremor) LUE Sensation: WFL - Light Touch;WFL - Proprioception LUE Coordination: WFL - gross/fine motor;WFL -  gross motor Right Lower Extremity Assessment RLE ROM/Strength/Tone: Within functional levels RLE Sensation: WFL - Light Touch;WFL - Proprioception RLE Coordination: WFL - gross/fine motor Left Lower Extremity Assessment LLE ROM/Strength/Tone: Within functional levels LLE Sensation: WFL - Light Touch;WFL - Proprioception LLE Coordination: WFL - gross/fine motor Trunk Assessment Trunk  Assessment: Kyphotic   Mobility Bed Mobility Bed Mobility: Sit to Supine Sit to Supine: 5: Supervision Details for Bed Mobility Assistance: Pt required supervision for safety. Transfers Transfers: Sit to Stand;Stand to Sit Sit to Stand: 4: Min guard;From chair/3-in-1;With upper extremity assist;With armrests Stand to Sit: 4: Min guard;To bed;With upper extremity assist Details for Transfer Assistance: Pt required min guard assist for safety due to initial unsteadiness upon standing.       Balance Balance Balance Assessed: Yes Static Sitting Balance Static Sitting - Balance Support: Bilateral upper extremity supported;Feet supported Static Sitting - Level of Assistance: 7: Independent Static Sitting - Comment/# of Minutes: 5 minutes Static Standing Balance Static Standing - Balance Support: During functional activity;No upper extremity supported Static Standing - Level of Assistance: Other (comment) (Min guard) Static Standing - Comment/# of Minutes: Pt required min guard assist for safety while brushing her teeth at the sink.  End of Session OT - End of Session Equipment Utilized During Treatment: Gait belt Activity Tolerance: Patient tolerated treatment well Patient left: in bed;with call bell/phone within reach;with family/visitor present   Evern Bio 01/24/2012, 12:58 PM [phone]

## 2012-01-24 NOTE — Progress Notes (Signed)
INITIAL ADULT NUTRITION ASSESSMENT Date: 01/24/2012   Time: 3:11 PM Reason for Assessment: Nutrition Risk  ASSESSMENT: Female 78 y.o.  Dx: Stroke  Hx:  Past Medical History  Diagnosis Date  . Hypertension   . Coronary artery disease     Cabg x 4  . Myocardial infarction   . Angina   . Atrial fibrillation   . Diabetes mellitus     insulin dependent  . Shortness of breath   . Hypothyroidism   . GERD (gastroesophageal reflux disease)   . Ovarian cancer   . Rectal cancer   . Arthritis     Bilateral arms  . Anxiety   . Hyperlipemia   . Peripheral vascular disease     R Femerol artery stent   Related Meds:     . amLODipine  5 mg Oral Daily  . atorvastatin  40 mg Oral q1800  . calcium-vitamin D  1 tablet Oral Daily  . carvedilol  3.125 mg Oral BID WC  . furosemide  20 mg Oral Daily  . insulin aspart  0-5 Units Subcutaneous QHS  . insulin aspart  0-9 Units Subcutaneous TID WC  . insulin glargine  40 Units Subcutaneous q morning - 10a  . levothyroxine  25 mcg Oral QAC breakfast  . magnesium oxide  400 mg Oral Daily  . magnesium sulfate 1 - 4 g bolus IVPB  2 g Intravenous Once  . pantoprazole  40 mg Oral QHS  . potassium chloride      . potassium chloride SA      . potassium chloride SA  20 mEq Oral Daily  . potassium chloride  40 mEq Oral BID  . rOPINIRole  0.25 mg Oral QHS  . DISCONTD: amLODipine  5 mg Oral Daily  . DISCONTD: atorvastatin  40 mg Oral Daily  . DISCONTD: carvedilol  3.125 mg Oral BID WC  . DISCONTD: carvedilol  6.25 mg Oral BID WC  . DISCONTD: furosemide  20 mg Oral Daily  . DISCONTD: insulin glargine  40 Units Subcutaneous QHS  . DISCONTD: levothyroxine  25 mcg Oral Daily  . DISCONTD: magnesium sulfate 1 - 4 g bolus IVPB  1 g Intravenous Once  . DISCONTD: pantoprazole  40 mg Oral Q1200  . DISCONTD: pantoprazole  40 mg Oral Q1200  . DISCONTD: potassium chloride  40 mEq Oral Once  . DISCONTD: potassium chloride  20 mEq Oral Daily  . DISCONTD:  potassium chloride  40 mEq Oral BID  . DISCONTD: potassium chloride  40 mEq Oral Once    Ht: 5\' 6"  (167.6 cm)  Wt: 147 lb 7.8 oz (66.9 kg)  Ideal Wt: 59 kg % Ideal Wt: 113%  Usual Wt: 164 lbs 1/13 % Usual Wt: 90%  10% wt loss x 3 months  Body mass index is 23.81 kg/(m^2). WNL  Food/Nutrition Related Hx: Pt reports 10 % wt loss since 1/13 due to recent hospitalizations (once each month in Jan, Feb, and March) for different illnesses then a loss of appetite. Pt lives alone and has not driven since 1/13. Per 24 hr recall pt has been eating dry toast/OJ at Breakfast, 4 peanut butter/crackers at lunch and son usually bring her a plate for supper from a restaurant which she only eats some of.  Pt meets criteria for severe malnutrition in the context of her chronic illness as evidenced by 10% wt loss in 3 months and her intake of </= 75% of estimated needs for >/= 1 month.  Labs:  CMP     Component Value Date/Time   NA 141 01/23/2012 1218   K 4.5 01/24/2012 0335   CL 108 01/23/2012 1218   CO2 21 01/23/2012 1218   GLUCOSE 121* 01/23/2012 1218   BUN 20 01/23/2012 1218   CREATININE 1.41* 01/23/2012 1218   CALCIUM 8.9 01/23/2012 1218   PROT 6.0 01/23/2012 1218   ALBUMIN 3.0* 01/23/2012 1218   AST 22 01/23/2012 1218   ALT 16 01/23/2012 1218   ALKPHOS 160* 01/23/2012 1218   BILITOT 0.2* 01/23/2012 1218   GFRNONAA 33* 01/23/2012 1218   GFRAA 38* 01/23/2012 1218   CBG (last 3)   Basename 01/24/12 1228 01/24/12 0751 01/23/12 2319  GLUCAP 143* 121* 183*    Lab Results  Component Value Date   HGBA1C 6.9* 01/24/2012    Intake/Output Summary (Last 24 hours) at 01/24/12 1518 Last data filed at 01/24/12 1400  Gross per 24 hour  Intake    290 ml  Output   1350 ml  Net  -1060 ml      Diet Order: CHO Modified Medium  Supplements/Tube Feeding: none  IVF: NA  Estimated Nutritional Needs:   Kcal: 1600-1800 Protein: 80-90 grams Fluid: >1.6 L/day  NUTRITION DIAGNOSIS: -Inadequate oral  intake (NI-2.1).  Status: Ongoing  RELATED TO: recent illnesses, poor appetite  AS EVIDENCE BY: 10% wt loss x 3 months  MONITORING/EVALUATION(Goals): Goal: Pt will consume >/= 90% of her estimated needs Monitor: po intake, weight  EDUCATION NEEDS: -No education needs identified at this time  INTERVENTION:  Health Shake BID  Dietitian [phone]  DOCUMENTATION CODES Per approved criteria  -Severe malnutrition in the context of chronic illness    Kendell Bane Cornelison 01/24/2012, 3:11 PM

## 2012-01-24 NOTE — Evaluation (Signed)
Agree with student PT evaluation.  Gaudencio Chesnut, PT DPT [phone]  

## 2012-01-24 NOTE — Progress Notes (Signed)
[facility]  Subjective: No Complaints.  She was taking a nap when I entered.  Objective: Vital signs in last 24 hours: Temp:  [98.3 F (36.8 C)-99.2 F (37.3 C)] 99.2 F (37.3 C) (04/22 0700) Pulse Rate:  [54-111] 88  (04/22 1000) Resp:  [13-26] 22  (04/22 1000) BP: (100-165)/(52-116) 100/72 mmHg (04/22 1000) SpO2:  [95 %-100 %] 96 % (04/22 1000) Last BM Date: 01/22/12  Intake/Output from previous day: 04/21 0701 - 04/22 0700 In: 290 [P.O.:240; IV Piggyback:50] Out: 1000 [Urine:1000] Intake/Output this shift: Total I/O In: -  Out: 150 [Urine:150]  Medications Current Facility-Administered Medications  Medication Dose Route Frequency Provider Last Rate Last Dose  . amLODipine (NORVASC) tablet 5 mg  5 mg Oral Daily Micki Riley, MD      . atorvastatin (LIPITOR) tablet 40 mg  40 mg Oral q1800 Carmell Austria, MD   40 mg at 01/23/12 1600  . calcium-vitamin D (OSCAL WITH D) 500-200 MG-UNIT per tablet 1 tablet  1 tablet Oral Daily Layne Benton, NP      . carvedilol (COREG) tablet 3.125 mg  3.125 mg Oral BID WC Layne Benton, NP      . furosemide (LASIX) tablet 20 mg  20 mg Oral Daily Carmell Austria, MD   20 mg at 01/24/12 0942  . insulin aspart (novoLOG) injection 0-5 Units  0-5 Units Subcutaneous QHS Carmell Austria, MD      . insulin aspart (novoLOG) injection 0-9 Units  0-9 Units Subcutaneous TID WC Carmell Austria, MD   1 Units at 01/24/12 0944  . insulin glargine (LANTUS) injection 40 Units  40 Units Subcutaneous q morning - 10a Carmell Austria, MD   40 Units at 01/24/12 0944  . labetalol (NORMODYNE,TRANDATE) injection 5 mg  5 mg Intravenous Q2H PRN Carmell Austria, MD      . levothyroxine (SYNTHROID, LEVOTHROID) tablet 25 mcg  25 mcg Oral QAC breakfast Carmell Austria, MD   25 mcg at 01/24/12 [phone]  . magnesium oxide (MAG-OX) tablet 400 mg  400 mg Oral Daily Carmell Austria, MD   400 mg at 01/24/12 0942  . magnesium sulfate IVPB 2 g 50 mL  2 g Intravenous Once Carmell Austria,  MD   2 g at 01/23/12 1648  . ondansetron (ZOFRAN) 4 MG/2ML injection        4 mg at 01/23/12 1250  . ondansetron (ZOFRAN) injection 4 mg  4 mg Intravenous Q6H PRN Carmell Austria, MD   4 mg at 01/23/12 1712  . pantoprazole (PROTONIX) EC tablet 40 mg  40 mg Oral QHS Layne Benton, NP      . potassium chloride 20 MEQ/15ML (10%) liquid        40 mEq at 01/23/12 1604  . potassium chloride SA (K-DUR,KLOR-CON) 20 MEQ CR tablet        40 mEq at 01/24/12 0951  . potassium chloride SA (K-DUR,KLOR-CON) CR tablet 20 mEq  20 mEq Oral Daily Layne Benton, NP      . potassium chloride SA (K-DUR,KLOR-CON) CR tablet 40 mEq  40 mEq Oral BID Layne Benton, NP      . rOPINIRole (REQUIP) tablet 0.25 mg  0.25 mg Oral QHS Carmell Austria, MD   0.25 mg at 01/23/12 2132  . DISCONTD: amLODipine (NORVASC) 1 mg/mL oral suspension 5 mg  5 mg Oral Daily Layne Benton, NP      . DISCONTD: atorvastatin (LIPITOR) tablet 40 mg  40  mg Oral Daily Layne Benton, NP      . DISCONTD: carvedilol (COREG) tablet 3.125 mg  3.125 mg Oral BID WC Carmell Austria, MD   3.125 mg at 01/23/12 1600  . DISCONTD: carvedilol (COREG) tablet 6.25 mg  6.25 mg Oral BID WC Dwana Melena, PA   6.25 mg at 01/24/12 0942  . DISCONTD: furosemide (LASIX) tablet 20 mg  20 mg Oral Daily Layne Benton, NP      . DISCONTD: insulin glargine (LANTUS) injection 40 Units  40 Units Subcutaneous QHS Layne Benton, NP      . DISCONTD: levothyroxine (SYNTHROID, LEVOTHROID) tablet 25 mcg  25 mcg Oral Daily Layne Benton, NP      . DISCONTD: magnesium sulfate IVPB 1 g 100 mL  1 g Intravenous Once Carmell Austria, MD      . DISCONTD: pantoprazole (PROTONIX) EC tablet 40 mg  40 mg Oral Q1200 Carmell Austria, MD   40 mg at 01/23/12 1600  . DISCONTD: pantoprazole (PROTONIX) EC tablet 40 mg  40 mg Oral Q1200 Layne Benton, NP      . DISCONTD: potassium chloride (KLOR-CON) packet 40 mEq  40 mEq Oral Once Carmell Austria, MD      . DISCONTD: potassium chloride 20 MEQ/15ML (10%) liquid 20 mEq  20 mEq  Oral Daily Carmell Austria, MD      . DISCONTD: potassium chloride 20 MEQ/15ML (10%) liquid 40 mEq  40 mEq Oral BID Carmell Austria, MD   40 mEq at 01/23/12 2133  . DISCONTD: potassium chloride 20 MEQ/15ML (10%) liquid 40 mEq  40 mEq Oral Once Carmell Austria, MD        PE: General appearance: alert, cooperative and no distress Lungs: clear to auscultation bilaterally Heart: regular rate and rhythm, S1, S2 normal, no murmur, click, rub or gallop Extremities: No LEE Pulses: 2+ and symmetric  Lab Results:   Basename 01/23/12 1218  WBC 5.1  HGB 11.1*  HCT 34.1*  PLT 140*   BMET  Basename 01/24/12 0335 01/23/12 1218  NA -- 141  K 4.5 3.1*  CL -- 108  CO2 -- 21  GLUCOSE -- 121*  BUN -- 20  CREATININE -- 1.41*  CALCIUM -- 8.9   Magnesium:  2.3   Assessment/Plan  Active Problems: 1. Right cerebellar CVA  2. NVST, 33 Beat this admission. History of NSVT previous admission in October  3. Coronary artery disease  4. Coronary artery bypass grafting x4 in 2000  5. history of hypertension  6 peripheral artery disease  7 history of colon cancer  8 history of ovarian cancer  9 diabetes mellitus  10 hyperlipidemia  Plan:  She is doing very well.  Many PVC's on telemetry but no further NSVT.  Coreg was increased 01/23/12 to 6.25(Home dose).  Mag and K+ were repleted and now WNL.  MRI scheduled for today.  Contnue home mag-ox 400mg  daily.  Monitor K+.  BP ok: 100/72 - 148/61.  She may be very sensitive to lower mag/K   LOS: 1 day    HAGER,BRYAN W 01/24/2012 11:43 AM  ATTENDING ATTESTATION:  I have seen and examined the patient along with Wilburt Finlay, PA.  I have reviewed the chart, notes and new data.  I agree with Bryan's note.  Brief Description: 78 y/o woman with long PMH as noted in the initial consult note admitted for CVA - small cerebellar bleed noted. [facility] consulted for NSVT - does have history of this from  07/2011 --> workup demonstrated mild-moderately reduced EF ~40-45% with  non-ischemic Myoview.  K & Mag have been repleted & BB written for - but home dose per Dr. Hazle Coca note is 6.25 mg po bid not 3.125 mg bid.   No further NSVT but frequent PVCs noted.   Key new complaints: no new c/o  Key examination changes: agree with exam above.  Key new findings / data: Cr 1.41 - improved; CBC stable; No new ECG  PLAN:  Continue with BB - if baseline HR will tolerate, would preferentially increase Coreg dose to 6.25 mg bid as opposed to using Amlodipine  Continue to monitor K+ & Mag (levels normal now)  Will discuss with Dr. Allyson Sabal, but would not likely proceed with invasive evaluation at this time given cerebellar hemorrhage - but may need eval once this is stable given her CAD-CABG history.  Marykay Lex, M.D., M.S. [facility] [address] [place], [place]  [address]  [phone]  01/24/2012 5:25 PM

## 2012-01-25 ENCOUNTER — Inpatient Hospital Stay (HOSPITAL_COMMUNITY): Payer: Medicare Other

## 2012-01-25 DIAGNOSIS — I480 Paroxysmal atrial fibrillation: Secondary | ICD-10-CM | POA: Diagnosis present

## 2012-01-25 DIAGNOSIS — I739 Peripheral vascular disease, unspecified: Secondary | ICD-10-CM | POA: Diagnosis present

## 2012-01-25 DIAGNOSIS — Z951 Presence of aortocoronary bypass graft: Secondary | ICD-10-CM | POA: Diagnosis present

## 2012-01-25 DIAGNOSIS — I10 Essential (primary) hypertension: Secondary | ICD-10-CM | POA: Diagnosis present

## 2012-01-25 DIAGNOSIS — E876 Hypokalemia: Secondary | ICD-10-CM | POA: Diagnosis present

## 2012-01-25 DIAGNOSIS — I639 Cerebral infarction, unspecified: Secondary | ICD-10-CM | POA: Diagnosis present

## 2012-01-25 DIAGNOSIS — I447 Left bundle-branch block, unspecified: Secondary | ICD-10-CM | POA: Diagnosis present

## 2012-01-25 DIAGNOSIS — E785 Hyperlipidemia, unspecified: Secondary | ICD-10-CM | POA: Diagnosis present

## 2012-01-25 MED ORDER — GABAPENTIN 100 MG PO CAPS
100.0000 mg | ORAL_CAPSULE | Freq: Every day | ORAL | Status: DC
  Filled 2012-01-25: qty 1

## 2012-01-25 MED ORDER — ONDANSETRON HCL 4 MG/2ML IJ SOLN
2.0000 mg | Freq: Once | INTRAMUSCULAR | Status: AC
  Administered 2012-01-25: 2 mg via INTRAVENOUS

## 2012-01-25 MED ORDER — LORAZEPAM 2 MG/ML IJ SOLN
1.0000 mg | Freq: Once | INTRAMUSCULAR | Status: AC
  Administered 2012-01-25: 1 mg via INTRAVENOUS

## 2012-01-25 MED ORDER — LORAZEPAM 2 MG/ML IJ SOLN
INTRAMUSCULAR | Status: AC
  Administered 2012-01-25: 1 mg
  Filled 2012-01-25: qty 1

## 2012-01-25 MED ORDER — CARVEDILOL 6.25 MG PO TABS
6.2500 mg | ORAL_TABLET | Freq: Two times a day (BID) | ORAL | Status: DC
  Filled 2012-01-25 (×2): qty 1

## 2012-01-25 MED ORDER — GABAPENTIN 100 MG PO CAPS: 100.0000 mg | ORAL_CAPSULE | Freq: Every day | ORAL | Status: DC

## 2012-01-25 MED ORDER — CYCLOBENZAPRINE HCL 10 MG PO TABS
10.0000 mg | ORAL_TABLET | Freq: Three times a day (TID) | ORAL | Status: DC | PRN
  Filled 2012-01-25 (×2): qty 1

## 2012-01-25 MED ORDER — AMLODIPINE BESYLATE 5 MG PO TABS: 5.0000 mg | ORAL_TABLET | Freq: Every day | ORAL | Status: DC

## 2012-01-25 NOTE — Progress Notes (Signed)
Subjective:  Awake and alert, no chest pain or SOB  Objective:  Vital Signs in the last 24 hours: Temp:  [98.6 F (37 C)-99.4 F (37.4 C)] 98.6 F (37 C) (04/23 0700) Pulse Rate:  [56-80] 73  (04/23 0800) Resp:  [14-21] 20  (04/23 0800) BP: (103-156)/(37-90) 141/70 mmHg (04/23 0800) SpO2:  [92 %-100 %] 99 % (04/23 0800)  Intake/Output from previous day:  Intake/Output Summary (Last 24 hours) at 01/25/12 1036 Last data filed at 01/24/12 1846  Gross per 24 hour  Intake    120 ml  Output    600 ml  Net   -480 ml    Physical Exam: General appearance: alert, cooperative and no distress Lungs: clear to auscultation bilaterally Heart: regular rate and rhythm   Rate: 78  Rhythm: normal sinus rhythm and PVCs, LBBB  Lab Results:  [facility] 01/23/12 1218  WBC 5.1  HGB 11.1*  PLT 140*    Basename 01/24/12 0335 01/23/12 1218  NA -- 141  K 4.5 3.1*  CL -- 108  CO2 -- 21  GLUCOSE -- 121*  BUN -- 20  CREATININE -- 1.41*    Basename 01/24/12 0847 01/24/12 0012  TROPONINI <0.30 <0.30   Hepatic Function Panel  Basename 01/23/12 1218  PROT 6.0  ALBUMIN 3.0*  AST 22  ALT 16  ALKPHOS 160*  BILITOT 0.2*  BILIDIR --  IBILI --   No results found for this basename: CHOL in the last 72 hours No results found for this basename: INR in the last 72 hours  Imaging: Imaging results have been reviewed No CXR done this admission  Cardiac Studies:  Assessment/Plan:   Principal Problem:  *CVA, Rt brain Active Problems:  NSVT, 33bts in setting of hypokalemia, (3.1) on admission  CABG 2000. PCI 2/10, EF 40-45% by Myoview 10/12, (low risk)  Hypokalemia  Hypertension  PAF. NSR on admission  PVD, RFA PTA 2/12  Hyperlipemia  LBBB (left bundle branch block)  Plan- Consider CXR prior to discharge. MD to see, no further cardiac work up now, increase Coreg to 6.25mg  BID.    Corine Shelter PA-C 01/25/2012, 10:36 AM

## 2012-01-25 NOTE — Discharge Instructions (Signed)
STROKE/TIA DISCHARGE INSTRUCTIONS SMOKING Cigarette smoking nearly doubles your risk of having a stroke & is the single most alterable risk factor  If you smoke or have smoked in the last 12 months, you are advised to quit smoking for your health.  Most of the excess cardiovascular risk related to smoking disappears within a year of stopping.  Ask you doctor about anti-smoking medications  [place] Quit Line: 1-800-QUIT NOW  Free Smoking Cessation Classes [phone]  CHOLESTEROL Know your levels; limit fat & cholesterol in your diet  Lipid Panel     Component Value Date/Time   CHOL  Value: 113        ATP III CLASSIFICATION:  <200     mg/dL   Desirable  098-119  mg/dL   Borderline High  >=147    mg/dL   High        06/01/5620 0145   TRIG 66 11/23/2008 0145   HDL 34* 11/23/2008 0145   CHOLHDL 3.3 11/23/2008 0145   VLDL 13 11/23/2008 0145   LDLCALC  Value: 66        Total Cholesterol/HDL:CHD Risk Coronary Heart Disease Risk Table                     Men   Women  1/2 Average Risk   3.4   3.3  Average Risk       5.0   4.4  2 X Average Risk   9.6   7.1  3 X Average Risk  23.4   11.0        Use the calculated Patient Ratio above and the CHD Risk Table to determine the patient's CHD Risk.        ATP III CLASSIFICATION (LDL):  <100     mg/dL   Optimal  308-657  mg/dL   Near or Above                    Optimal  130-159  mg/dL   Borderline  846-962  mg/dL   High  >952     mg/dL   Very High 8/41/3244 0102      Many patients benefit from treatment even if their cholesterol is at goal.  Goal: Total Cholesterol (CHOL) less than 160  Goal:  Triglycerides (TRIG) less than 150  Goal:  HDL greater than 40  Goal:  LDL (LDLCALC) less than 100   BLOOD PRESSURE American Stroke Association blood pressure target is less that 120/80 mm/Hg  Your discharge blood pressure is:  BP: 141/70 mmHg  Monitor your blood pressure  Limit your salt and alcohol intake  Many individuals will require more than one medication  for high blood pressure  DIABETES (A1c is a blood sugar average for last 3 months) Goal HGBA1c is under 7% (HBGA1c is blood sugar average for last 3 months)  Diabetes: Diagnosis of diabetes:  Your A1c:6.9 %    Lab Results  Component Value Date   HGBA1C 6.9* 01/24/2012     Your HGBA1c can be lowered with medications, healthy diet, and exercise.  Check your blood sugar as directed by your physician  Call your physician if you experience unexplained or low blood sugars.  PHYSICAL ACTIVITY/REHABILITATION Goal is 30 minutes at least 4 days per week    Activity:   Increase activity slowly, and No driving, Therapies:   Physical Therapy: Outpatient  Activity decreases your risk of heart attack and stroke and makes your heart stronger.  It  helps control your weight and blood pressure; helps you relax and can improve your mood.  Participate in a regular exercise program.  Talk with your doctor about the best form of exercise for you (dancing, walking, swimming, cycling).  DIET/WEIGHT Goal is to maintain a healthy weight  Your discharge diet is: Carb Control thin liquids Your height is:  Height: 5\' 6"  (167.6 cm) Your current weight is: Weight: 66.9 kg (147 lb 7.8 oz) Your Body Mass Index (BMI) is:  BMI (Calculated): 23.9   Following the type of diet specifically designed for you will help prevent another stroke.  Your goal weight range is:  ***  Your goal Body Mass Index (BMI) is 19-24.  Healthy food habits can help reduce 3 risk factors for stroke:  High cholesterol, hypertension, and excess weight.  RESOURCES Stroke/Support Group:  Call [phone]  they meet the 3rd Sunday of the month on the Rehab Unit at [facility], [place] ( no meetings June, July & Aug).  STROKE EDUCATION PROVIDED/REVIEWED AND GIVEN TO PATIENT Stroke warning signs and symptoms How to activate emergency medical system (call 911). Medications prescribed at discharge. Need for follow-up after discharge. Personal risk  factors for stroke. Pneumonia vaccine given:   {STROKE DC YES/NO/DATE:22363} Flu vaccine given:   {STROKE DC YES/NO/DATE:22363} My questions have been answered, the writing is legible, and I understand these instructions.  I will adhere to these goals & educational materials that have been provided to me after my discharge from the hospital.     Patient to go to [facility]  [phone]. They will contact Mrs. Bogue with date and time of appointment.

## 2012-01-25 NOTE — Progress Notes (Signed)
Stroke Team Progress Note  HISTORY Catherine Clayton is an 78 y.o. female who had nausea and generalized shaking and was taken to [facility] ED 01/23/2012 at 0543  where she was found to have a small right cerebellar hemorrhage. BP on arrival there was 182/86. Patient reports no other symptoms. She also complains of long-standing urge to move her legs around a lot and especially closer to the night. Patient was not a TPA candidate secondary to hemorrhage. She was admitted to the neuro ICU for further evaluation and treatment.  SUBJECTIVE Daughter at bedside. Patient with leg spasms during the night. Received zofran 6 mg for nausea, ativan 1 mg and flexeril ordered. Per pt, she has had similar restless legs at night prior to admission.  OBJECTIVE Most recent Vital Signs: Filed Vitals:   01/25/12 0400 01/25/12 0500 01/25/12 0600 01/25/12 0700  BP: 132/60 129/48 126/54 127/70  Pulse: 69 70 69 67  Temp: 98.8 F (37.1 C)   98.6 F (37 C)  TempSrc: Oral   Oral  Resp: 19 17 17 15   Height:      Weight:      SpO2: 99% 98% 100% 100%   CBG (last 3)  Basename 01/24/12 2232 01/24/12 1731 01/24/12 1228  GLUCAP 164* 136* 143*   Intake/Output from previous day: 04/22 0701 - 04/23 0700 In: 360 [P.O.:360] Out: 850 [Urine:850]  IV Fluid Intake:     MEDICATIONS    . amLODipine  5 mg Oral Daily  . atorvastatin  40 mg Oral q1800  . calcium-vitamin D  1 tablet Oral Daily  . carvedilol  3.125 mg Oral BID WC  . furosemide  20 mg Oral Daily  . insulin aspart  0-5 Units Subcutaneous QHS  . insulin aspart  0-9 Units Subcutaneous TID WC  . insulin glargine  40 Units Subcutaneous q morning - 10a  . levothyroxine  25 mcg Oral QAC breakfast  . LORazepam      . LORazepam  1 mg Intravenous Once  . magnesium oxide  400 mg Oral Daily  . ondansetron  2 mg Intravenous Once  . pantoprazole  40 mg Oral QHS  . potassium chloride SA      . potassium chloride SA  20 mEq Oral Daily  . rOPINIRole  0.25 mg Oral QHS    . DISCONTD: amLODipine  5 mg Oral Daily  . DISCONTD: atorvastatin  40 mg Oral Daily  . DISCONTD: carvedilol  6.25 mg Oral BID WC  . DISCONTD: furosemide  20 mg Oral Daily  . DISCONTD: insulin glargine  40 Units Subcutaneous QHS  . DISCONTD: levothyroxine  25 mcg Oral Daily  . DISCONTD: pantoprazole  40 mg Oral Q1200  . DISCONTD: pantoprazole  40 mg Oral Q1200  . DISCONTD: potassium chloride  20 mEq Oral Daily  . DISCONTD: potassium chloride  40 mEq Oral BID  . DISCONTD: potassium chloride  40 mEq Oral Once  . DISCONTD: potassium chloride  40 mEq Oral BID   PRN:  cyclobenzaprine, labetalol, ondansetron (ZOFRAN) IV  Diet:  Carb Control thin liquids Activity:  As tolerated DVT Prophylaxis:  SCDs   CLINICALLY SIGNIFICANT STUDIES CBC    Component Value Date/Time   WBC 5.1 01/23/2012 1218   RBC 3.81* 01/23/2012 1218   HGB 11.1* 01/23/2012 1218   HCT 34.1* 01/23/2012 1218   PLT 140* 01/23/2012 1218   MCV 89.5 01/23/2012 1218   MCH 29.1 01/23/2012 1218   MCHC 32.6 01/23/2012 1218   RDW 13.9  01/23/2012 1218   LYMPHSABS 2.2 11/22/2008 1830   MONOABS 0.5 11/22/2008 1830   EOSABS 0.2 11/22/2008 1830   BASOSABS 0.0 11/22/2008 1830   CMP    Component Value Date/Time   NA 141 01/23/2012 1218   K 4.5 01/24/2012 0335   CL 108 01/23/2012 1218   CO2 21 01/23/2012 1218   GLUCOSE 121* 01/23/2012 1218   BUN 20 01/23/2012 1218   CREATININE 1.41* 01/23/2012 1218   CALCIUM 8.9 01/23/2012 1218   PROT 6.0 01/23/2012 1218   ALBUMIN 3.0* 01/23/2012 1218   AST 22 01/23/2012 1218   ALT 16 01/23/2012 1218   ALKPHOS 160* 01/23/2012 1218   BILITOT 0.2* 01/23/2012 1218   GFRNONAA 33* 01/23/2012 1218   GFRAA 38* 01/23/2012 1218   COAGS Lab Results  Component Value Date   INR 0.95 01/25/2011   INR 1.1 11/27/2008   INR 1.0 11/25/2008   Lipid Panel    Component Value Date/Time   CHOL  Value: 113        ATP III CLASSIFICATION:  <200     mg/dL   Desirable  528-413  mg/dL   Borderline High  >=244    mg/dL   High         0/07/2724 0145   TRIG 66 11/23/2008 0145   HDL 34* 11/23/2008 0145   CHOLHDL 3.3 11/23/2008 0145   VLDL 13 11/23/2008 0145   LDLCALC  Value: 66        Total Cholesterol/HDL:CHD Risk Coronary Heart Disease Risk Table                     Men   Women  1/2 Average Risk   3.4   3.3  Average Risk       5.0   4.4  2 X Average Risk   9.6   7.1  3 X Average Risk  23.4   11.0        Use the calculated Patient Ratio above and the CHD Risk Table to determine the patient's CHD Risk.        ATP III CLASSIFICATION (LDL):  <100     mg/dL   Optimal  366-440  mg/dL   Near or Above                    Optimal  130-159  mg/dL   Borderline  347-425  mg/dL   High  >956     mg/dL   Very High 3/87/5643 3295   HgbA1C  Lab Results  Component Value Date   HGBA1C 6.9* 01/24/2012   Cardiac Panel (last 3 results)   Basename 01/24/12 0847 01/24/12 0012 01/23/12 1806  CKTOTAL 49 54 55  CKMB 2.2 2.6 2.8  TROPONINI <0.30 <0.30 <0.30  RELINDX RELATIVE INDEX IS INVALID RELATIVE INDEX IS INVALID RELATIVE INDEX IS INVALID   Urinalysis No results found for this basename: colorurine,  appearanceur,  labspec,  phurine,  glucoseu,  hgbur,  bilirubinur,  ketonesur,  proteinur,  urobilinogen,  nitrite,  leukocytesur   Urine Drug Screen  No results found for this basename: labopia,  cocainscrnur,  labbenz,  amphetmu,  thcu,  labbarb    Alcohol Level No results found for this basename: eth    MRI of the brain  01/24/2012  No acute infarct.     MRA of the brain  01/24/2012  Intracranial atherosclerotic type changes as detailed above predominately involving branch vessels.   2D Echocardiogram  Carotid Doppler    Physical Exam    Pleasant elderly Caucasian lady currently not in distress.Awake alert. Afebrile. Head is nontraumatic. Neck is supple without bruit. Hearing is normal. Cardiac exam no murmur or gallop. Lungs are clear to auscultation. Distal pulses are well felt.  Neurological Exam :Awake  Alert oriented x 3. Normal  speech and language.eye movements full without nystagmus. Face symmetric. Tongue midline. Normal strength, tone, reflexes and coordination. Normal sensation. Gait deferred. Mild action tremors both upper and lower extremities and absent at rest.  ASSESSMENT [name] is a 78 y.o. female with a small 6 mm cerebellar hemorrhage per CT at [facility] not see on MRI repeated at [facility]. Hemorrhage may be so small that MRI may have missed it within its 5 mm slices. Hemorrhage secondary to hypertension. Doubt underlying lesion like metastasis or,amyloid angiopathy. Could not do MRI with contrast secondary to Cr. 1.41.   -restless legs -old cerebellar infarct per MRI -Hypertension. Lisinopril recently stopped. norvasc added.  -Coronary artery disease  -Myocardial infarction  -Atrial fibrillation  -Diabetes mellitus  -Hypothyroidism  -GERD (gastroesophageal reflux disease)  -Ovarian cancer  -Rectal cancer  -Arthritis  -Anxiety  -renal insufficiency, Cr. 1.41 (?reason why lisinopril was stopped. Daughter unsure)  Hospital day # 2  TREATMENT/PLAN -home health PT -neurontin 100 mg q hs for restless legs -Discharge home  Joaquin Music, ANP-BC, GNP-BC [facility] Pager: [phone] 01/25/2012 8:11 AM  Dr. Delia Heady, Stroke Center Medical Director, has personally reviewed chart, pertinent data, examined the patient and developed the plan of care.

## 2012-01-25 NOTE — Progress Notes (Signed)
Physical Therapy Treatment Patient Details Name: Catherine Clayton MRN: [id number] DOB: [birth year] Today's Date: 01/25/2012 Time: 9562-1308 PT Time Calculation (min): 27 min  PT Assessment / Plan / Recommendation Comments on Treatment Session  Pt progressing well with all mobility and ambulation.  Pt appears to be at baseline for all activities, as per daughter.  Pt ready for d/c with outpatient physical therapy and initial 24 hour supervision when medically cleared by physician.    Follow Up Recommendations  Outpatient PT;Supervision/Assistance - 24 hour (24 hour supervision initially)    Equipment Recommendations  None recommended by PT    Frequency Min 4X/week   Plan Discharge plan needs to be updated;Frequency remains appropriate    Precautions / Restrictions Precautions Precautions: Fall Restrictions Weight Bearing Restrictions: No   Pertinent Vitals/Pain     Mobility  Bed Mobility Bed Mobility: Supine to Sit;Sitting - Scoot to [place] of Bed;Sit to Supine;Scooting to [facility] Supine to Sit: 6: Modified independent (Device/Increase time) Sitting - Scoot to Edge of Bed: 6: Modified independent (Device/Increase time) Sit to Supine: 6: Modified independent (Device/Increase time) Scooting to [facility]: 4: Min assist Details for Bed Mobility Assistance: Pt requried min assist in order to scoot to [facility].  Pt more independent with all other bed mobility. Transfers Transfers: Sit to Stand;Stand to Sit Sit to Stand: 5: Supervision;From bed;With upper extremity assist Stand to Sit: 5: Supervision;To bed;With upper extremity assist Details for Transfer Assistance: Pt required supervision for safety with all transfers. Ambulation/Gait Ambulation/Gait Assistance: 5: Supervision Ambulation Distance (Feet): 150 Feet Assistive device: None Ambulation/Gait Assistance Details: Pt required supervision for safety with ambulation.  Daughter reports that her ambulation is at her baseline. Gait Pattern:  Step-through pattern;Decreased stride length;Narrow base of support Gait velocity: Decreased Stairs: No Modified Rankin (Stroke Patients Only) Modified Rankin: Moderately severe disability    Exercises     PT Goals Acute Rehab PT Goals PT Goal Formulation: With patient/family Time For Goal Achievement: 01/31/12 Potential to Achieve Goals: Good Pt will go Supine/Side to Sit: Independently PT Goal: Supine/Side to Sit - Progress: Progressing toward goal Pt will go Sit to Supine/Side: Independently PT Goal: Sit to Supine/Side - Progress: Progressing toward goal Pt will go Sit to Stand: with modified independence;with upper extremity assist PT Goal: Sit to Stand - Progress: Progressing toward goal Pt will go Stand to Sit: with modified independence;with upper extremity assist PT Goal: Stand to Sit - Progress: Progressing toward goal Pt will Ambulate: >150 feet;with modified independence PT Goal: Ambulate - Progress: Progressing toward goal  Visit Information  Last PT Received On: 01/25/12 Assistance Needed: +1    Subjective Data      Cognition  Overall Cognitive Status: Appears within functional limits for tasks assessed/performed Arousal/Alertness: Awake/alert Orientation Level: Oriented X4 / Intact Behavior During Session: [facility] for tasks performed    Balance  Balance Balance Assessed: Yes Static Sitting Balance Static Sitting - Balance Support: Bilateral upper extremity supported;Feet supported Static Sitting - Level of Assistance: 7: Independent Static Standing Balance Static Standing - Balance Support: During functional activity;No upper extremity supported Static Standing - Level of Assistance: 5: Stand by assistance Static Standing - Comment/# of Minutes: Pt required stand by assitance for safety while washing her hands at the sink.  End of Session PT - End of Session Equipment Utilized During Treatment: Gait belt Activity Tolerance: Patient tolerated treatment  well Patient left: in bed;with call bell/phone within reach;with family/visitor present Nurse Communication: Mobility status;Other (comment) (Discharge plan)  Ezzard Standing SPT 01/25/2012, 9:59 AM

## 2012-01-25 NOTE — Progress Notes (Signed)
CARE MANAGEMENT NOTE 01/25/2012  Patient:  Catherine Clayton, Catherine Clayton   Account Number:  [id number]  Date Initiated:  01/25/2012  Documentation initiated by:  Vance Peper  Subjective/Objective Assessment:   78 yr old female adm for cerebral hemorrhage     Action/Plan:   Spoke with patient and her daughter Catherine Clayton. They would like outpatient therapy in [place]. Contacted [facility]. outpt rehab. Faxed info to [facility] [phone].They will conact them for appt. time and date.   Anticipated DC Date:  01/25/2012   Anticipated DC Plan:  HOME/SELF CARE      DC Planning Services  CM consult      PAC Choice  NA   Choice offered to / List presented to:  C-4 Adult Children   DME arranged  NA      DME agency  NA     HH arranged  NA      Status of service:  Completed, signed off    Discharge Disposition:  HOME/SELF CARE

## 2012-01-25 NOTE — Discharge Summary (Signed)
Stroke Discharge Summary  Patient ID: Catherine Clayton   MRN: [id number]      DOB: [birth year]  Date of Admission: 01/23/2012 Date of Discharge: 01/25/2012  Attending Physician:  Darcella Cheshire, MD, Stroke MD  Consulting Physician(s):     Nicki Guadalajara, MD (card)  Patient's PCP:  Everlean Cherry, MD  Discharge Diagnoses:  Principal Problem:  *CVA, cerebellar - small 6 mm cerebellar hemorrhage per CT at [facility] not see on repeated MRI or CT at [facility] Active Problems:  Hypertension  CABG 2000. PCI 2/10, EF 40-45% by Myoview 10/12, (low risk)  PAF. NSR on admission  PVD, RFA PTA 2/12  Hyperlipemia  NSVT, 33bts in setting of hypokalemia, (3.1) on admission  LBBB (left bundle branch block)  Hypokalemia -restless legs  -old cerebellar infarct per MRI  -Hypertension. Lisinopril recently stopped. norvasc added.  -Coronary artery disease  -Myocardial infarction  -Atrial fibrillation  -Diabetes mellitus  -Hypothyroidism  -GERD (gastroesophageal reflux disease)  -Ovarian cancer  -Rectal cancer  -Arthritis  -Anxiety  -renal insufficiency, Cr. 1.41    BMI: Body mass index is 23.81 kg/(m^2).  Past Medical History  Diagnosis Date  . Hypertension   . Coronary artery disease     Cabg x 4  . Myocardial infarction   . Angina   . Atrial fibrillation   . Diabetes mellitus     insulin dependent  . Shortness of breath   . Hypothyroidism   . GERD (gastroesophageal reflux disease)   . Ovarian cancer   . Rectal cancer   . Arthritis     Bilateral arms  . Anxiety   . Hyperlipemia   . Peripheral vascular disease     R Femerol artery stent   Past Surgical History  Procedure Date  . Coronary artery bypass graft     x 4 1995  . Abdominal hysterectomy   . Tonsillectomy   . Appendectomy   . Back surgery     thoracic "cementing"  . Vascular surgery 01/26/2011    Common femoral stent   Medication List  As of 01/25/2012  1:22 PM   STOP taking these medications         aspirin  81 MG chewable tablet         TAKE these medications         amLODipine 5 MG tablet   Commonly known as: NORVASC   Take 1 tablet (5 mg total) by mouth daily.      atorvastatin 40 MG tablet   Commonly known as: LIPITOR   Take 40 mg by mouth daily.      calcium-vitamin D 500-200 MG-UNIT per tablet   Commonly known as: OSCAL WITH D   Take 1 tablet by mouth daily.      carvedilol 6.25 MG tablet   Commonly known as: COREG   Take 3.125 mg by mouth 2 (two) times daily with a meal.      clopidogrel 75 MG tablet   Commonly known as: PLAVIX   Take 75 mg by mouth daily.      furosemide 20 MG tablet   Commonly known as: LASIX   Take 20 mg by mouth daily.      gabapentin 100 MG capsule   Commonly known as: NEURONTIN   Take 1 capsule (100 mg total) by mouth at bedtime.      insulin glargine 100 UNIT/ML injection   Commonly known as: LANTUS   Inject 40 Units into the skin  daily.      insulin lispro 100 UNIT/ML injection   Commonly known as: HUMALOG   Inject 15 Units into the skin daily as needed. In the morning if CBG>150      levothyroxine 25 MCG tablet   Commonly known as: SYNTHROID, LEVOTHROID   Take 25 mcg by mouth daily.      pantoprazole 40 MG tablet   Commonly known as: PROTONIX   Take 40 mg by mouth at bedtime.      potassium chloride SA 20 MEQ tablet   Commonly known as: K-DUR,KLOR-CON   Take 20 mEq by mouth daily.      promethazine 12.5 MG suppository   Commonly known as: PHENERGAN   Place 12.5 mg rectally every 6 (six) hours as needed. For nausea      RABEprazole 20 MG tablet   Commonly known as: ACIPHEX   Take 20 mg by mouth 2 (two) times daily.      VITAMIN D PO   Take 125 mg by mouth every 7 (seven) days.           LABORATORY STUDIES CBC    Component Value Date/Time   WBC 5.1 01/23/2012 1218   RBC 3.81* 01/23/2012 1218   HGB 11.1* 01/23/2012 1218   HCT 34.1* 01/23/2012 1218   PLT 140* 01/23/2012 1218   MCV 89.5 01/23/2012 1218   MCH 29.1  01/23/2012 1218   MCHC 32.6 01/23/2012 1218   RDW 13.9 01/23/2012 1218   LYMPHSABS 2.2 11/22/2008 1830   MONOABS 0.5 11/22/2008 1830   EOSABS 0.2 11/22/2008 1830   BASOSABS 0.0 11/22/2008 1830   CMP    Component Value Date/Time   NA 141 01/23/2012 1218   K 4.5 01/24/2012 0335   CL 108 01/23/2012 1218   CO2 21 01/23/2012 1218   GLUCOSE 121* 01/23/2012 1218   BUN 20 01/23/2012 1218   CREATININE 1.41* 01/23/2012 1218   CALCIUM 8.9 01/23/2012 1218   PROT 6.0 01/23/2012 1218   ALBUMIN 3.0* 01/23/2012 1218   AST 22 01/23/2012 1218   ALT 16 01/23/2012 1218   ALKPHOS 160* 01/23/2012 1218   BILITOT 0.2* 01/23/2012 1218   GFRNONAA 33* 01/23/2012 1218   GFRAA 38* 01/23/2012 1218   COAGS Lab Results  Component Value Date   INR 0.95 01/25/2011   INR 1.1 11/27/2008   INR 1.0 11/25/2008   Lipid Panel    Component Value Date/Time   CHOL  Value: 113        ATP III CLASSIFICATION:  <200     mg/dL   Desirable  098-119  mg/dL   Borderline High  >=147    mg/dL   High        06/01/5620 0145   TRIG 66 11/23/2008 0145   HDL 34* 11/23/2008 0145   CHOLHDL 3.3 11/23/2008 0145   VLDL 13 11/23/2008 0145   LDLCALC  Value: 66        Total Cholesterol/HDL:CHD Risk Coronary Heart Disease Risk Table                     Men   Women  1/2 Average Risk   3.4   3.3  Average Risk       5.0   4.4  2 X Average Risk   9.6   7.1  3 X Average Risk  23.4   11.0        Use the calculated Patient Ratio above and the CHD Risk Table  to determine the patient's CHD Risk.        ATP III CLASSIFICATION (LDL):  <100     mg/dL   Optimal  119-147  mg/dL   Near or Above                    Optimal  130-159  mg/dL   Borderline  829-562  mg/dL   High  >130     mg/dL   Very High 8/65/7846 9629   HgbA1C  Lab Results  Component Value Date   HGBA1C 6.9* 01/24/2012   Urine Drug Screen  No results found for this basename: labopia, cocainscrnur, labbenz, amphetmu, thcu, labbarb    Alcohol Level No results found for this basename: eth   SIGNIFICANT DIAGNOSTIC  STUDIES CT Head 01/25/2012  Suspect stable small 4 x 6 mm dural calcification right tentorium adjacent to the superior cerebellum.  No visible acute hemorrhage.  MRI of the brain 01/24/2012 No acute infarct.  MRA of the brain 01/24/2012 Intracranial atherosclerotic type changes as detailed above predominately involving branch vessels.   History of Present Illness  Catherine Clayton is an 78 y.o. female who had nausea and generalized shaking and was taken to [facility] ED 01/23/2012 at 0543 where she was found to have a small right cerebellar hemorrhage. BP on arrival there was 182/86. Patient reports no other symptoms. She also complains of long-standing urge to move her legs around a lot and especially closer to the night. Patient was not a TPA candidate secondary to hemorrhage. She was admitted to the neuro ICU for further evaluation and treatment.   Hospital Course The patient's hemorrhage remained stable during the first 24h of admission, the time frame with highest risk of rebleeding. In fact, hemorrhage was not visible on MRI. Repeat CT also was unrevealing for new acute hemorrhage. Of note, but imaging sources did evaluate an old cerebellar infarct. Patient and family have no recollection of symptoms that may have been associated. Patient's symptoms of nausea and generalized shaking resolved.  Patient has vascular risk factors of hypertension. Home blood pressure medicines were resumed in Hospital with good results. Patient does report ongoing shaking of her legs. After questioning, it is felt patient likely has restless leg syndrome. She was started on Neurontin 100 mg each bedtime.  Patient with continued stroke symptoms of mild gait instability. Physical therapy, occupational therapy and speech therapy evaluated patient. They recommend home health physical therapy.  Discharge Exam  Blood pressure 136/61, pulse 57, temperature 99 F (37.2 C), temperature source Oral, resp. rate 19, height 5\' 6"  (1.676  m), weight 66.9 kg (147 lb 7.8 oz), SpO2 99.00%. Physical Exam Pleasant elderly Caucasian lady currently not in distress.Awake alert. Afebrile. Head is nontraumatic. Neck is supple without bruit. Hearing is normal. Cardiac exam no murmur or gallop. Lungs are clear to auscultation. Distal pulses are well felt.  Neurological Exam Awake Alert oriented x 3. Normal speech and language.eye movements full without nystagmus. Face symmetric. Tongue midline. Normal strength, tone, reflexes and coordination. Normal sensation. Gait deferred. Mild action tremors both upper and lower extremities and absent at rest  Discharge Diet   Carb Control thin liquids  Discharge Plan   - Disposition:  Home with family - clopidogrel 75 mg orally every day for secondary stroke prevention.  - consider adding low-dose aspirin to Plavix as patient with history of atrial fibrillation. Dr. Pearlean Brownie will address. - Ongoing risk factor control by Primary Care Physician. - Risk factor recommendations:  Hypertension target range 130-140/70-80 Lipid range - LDL < 100 and checked every 6 months, fasting  - Follow-up Everlean Cherry, MD in 1 week. - Follow-up with Dr. Delia Heady in 1 month.  Signed Annie Main, AVNP, ANP-BC, [facility] Nurse Practitioner 01/25/2012, 1:22 PM  Dr. Delia Heady, Stroke Center Medical Director, has personally reviewed chart, pertinent data, examined the patient and developed the plan of care.

## 2012-01-25 NOTE — Progress Notes (Signed)
Agree with student PT treatment note. Tiphany Fayson, PT DPT [phone]  

## 2012-01-25 NOTE — Progress Notes (Signed)
Patient awake complaining of nausea and leg spasms.  Zofran 4 mg IV given per MD prn order. Med uneffective. Dr. Roseanne Reno paged and calls back promptly. Report given additional 2 mg of IV Zofran ordered and Flexeril 10 mg po q 8 hrs prn orders for leg spasm. One time ativan 1 mg IV order also obtained for increased restlessness/anxiety. Patient states that she was feeling better after the second dose of zofran and leg spasm eased off and patient is resting quietly. Flexeril not given at this time. Will continue to assess

## 2012-04-09 ENCOUNTER — Inpatient Hospital Stay (HOSPITAL_COMMUNITY)
Admission: AD | Admit: 2012-04-09 | Discharge: 2012-04-14 | DRG: 281 | Disposition: A | Discharge status: Home / Self Care | Payer: Medicare Other | Source: Other Acute Inpatient Hospital | Attending: Cardiovascular Disease | Admitting: Cardiovascular Disease

## 2012-04-09 ENCOUNTER — Encounter (HOSPITAL_COMMUNITY): Payer: Self-pay | Admitting: *Deleted

## 2012-04-09 DIAGNOSIS — I252 Old myocardial infarction: Secondary | ICD-10-CM

## 2012-04-09 DIAGNOSIS — I447 Left bundle-branch block, unspecified: Secondary | ICD-10-CM | POA: Diagnosis present

## 2012-04-09 DIAGNOSIS — I480 Paroxysmal atrial fibrillation: Secondary | ICD-10-CM | POA: Diagnosis present

## 2012-04-09 DIAGNOSIS — N183 Chronic kidney disease, stage 3 unspecified: Secondary | ICD-10-CM | POA: Diagnosis present

## 2012-04-09 DIAGNOSIS — I129 Hypertensive chronic kidney disease with stage 1 through stage 4 chronic kidney disease, or unspecified chronic kidney disease: Secondary | ICD-10-CM | POA: Diagnosis present

## 2012-04-09 DIAGNOSIS — E876 Hypokalemia: Secondary | ICD-10-CM | POA: Diagnosis present

## 2012-04-09 DIAGNOSIS — E119 Type 2 diabetes mellitus without complications: Secondary | ICD-10-CM | POA: Diagnosis present

## 2012-04-09 DIAGNOSIS — I10 Essential (primary) hypertension: Secondary | ICD-10-CM | POA: Diagnosis present

## 2012-04-09 DIAGNOSIS — I25810 Atherosclerosis of coronary artery bypass graft(s) without angina pectoris: Secondary | ICD-10-CM | POA: Diagnosis present

## 2012-04-09 DIAGNOSIS — K219 Gastro-esophageal reflux disease without esophagitis: Secondary | ICD-10-CM | POA: Diagnosis present

## 2012-04-09 DIAGNOSIS — R001 Bradycardia, unspecified: Secondary | ICD-10-CM

## 2012-04-09 DIAGNOSIS — Z951 Presence of aortocoronary bypass graft: Secondary | ICD-10-CM | POA: Diagnosis present

## 2012-04-09 DIAGNOSIS — Z9861 Coronary angioplasty status: Secondary | ICD-10-CM

## 2012-04-09 DIAGNOSIS — Z8673 Personal history of transient ischemic attack (TIA), and cerebral infarction without residual deficits: Secondary | ICD-10-CM

## 2012-04-09 DIAGNOSIS — I214 Non-ST elevation (NSTEMI) myocardial infarction: Secondary | ICD-10-CM

## 2012-04-09 DIAGNOSIS — Z794 Long term (current) use of insulin: Secondary | ICD-10-CM

## 2012-04-09 DIAGNOSIS — Z7902 Long term (current) use of antithrombotics/antiplatelets: Secondary | ICD-10-CM

## 2012-04-09 DIAGNOSIS — I498 Other specified cardiac arrhythmias: Secondary | ICD-10-CM | POA: Diagnosis present

## 2012-04-09 DIAGNOSIS — E785 Hyperlipidemia, unspecified: Secondary | ICD-10-CM | POA: Diagnosis present

## 2012-04-09 DIAGNOSIS — I739 Peripheral vascular disease, unspecified: Secondary | ICD-10-CM | POA: Diagnosis present

## 2012-04-09 DIAGNOSIS — I2510 Atherosclerotic heart disease of native coronary artery without angina pectoris: Secondary | ICD-10-CM | POA: Diagnosis present

## 2012-04-09 DIAGNOSIS — E039 Hypothyroidism, unspecified: Secondary | ICD-10-CM | POA: Diagnosis present

## 2012-04-09 DIAGNOSIS — I639 Cerebral infarction, unspecified: Secondary | ICD-10-CM | POA: Diagnosis present

## 2012-04-09 HISTORY — DX: Bradycardia, unspecified: R00.1

## 2012-04-09 HISTORY — DX: Non-ST elevation (NSTEMI) myocardial infarction: I21.4

## 2012-04-09 HISTORY — DX: Type 2 diabetes mellitus without complications: E11.9

## 2012-04-09 LAB — CARDIAC PANEL(CRET KIN+CKTOT+MB+TROPI)
Relative Index: INVALID (ref 0.0–2.5)
Total CK: 77 U/L (ref 7–177)

## 2012-04-09 LAB — GLUCOSE, CAPILLARY: Glucose-Capillary: 101 mg/dL — ABNORMAL HIGH (ref 70–99)

## 2012-04-09 MED ORDER — ONDANSETRON HCL 4 MG/2ML IJ SOLN
4.0000 mg | Freq: Four times a day (QID) | INTRAMUSCULAR | Status: DC | PRN
  Administered 2012-04-13: 4 mg via INTRAVENOUS
  Filled 2012-04-09: qty 2

## 2012-04-09 MED ORDER — GABAPENTIN 100 MG PO CAPS
100.0000 mg | ORAL_CAPSULE | Freq: Every day | ORAL | Status: DC
  Administered 2012-04-09 – 2012-04-13 (×5): 100 mg via ORAL
  Filled 2012-04-09 (×6): qty 1

## 2012-04-09 MED ORDER — PANTOPRAZOLE SODIUM 40 MG PO TBEC
40.0000 mg | DELAYED_RELEASE_TABLET | Freq: Every day | ORAL | Status: DC
  Administered 2012-04-09 – 2012-04-13 (×5): 40 mg via ORAL
  Filled 2012-04-09 (×5): qty 1

## 2012-04-09 MED ORDER — INSULIN ASPART 100 UNIT/ML ~~LOC~~ SOLN
0.0000 [IU] | Freq: Three times a day (TID) | SUBCUTANEOUS | Status: DC
  Administered 2012-04-11 – 2012-04-13 (×3): 1 [IU] via SUBCUTANEOUS

## 2012-04-09 MED ORDER — HEPARIN (PORCINE) IN NACL 100-0.45 UNIT/ML-% IJ SOLN
850.0000 [IU]/h | INTRAMUSCULAR | Status: DC
  Administered 2012-04-09 (×2): 850 [IU]/h via INTRAVENOUS
  Filled 2012-04-09: qty 250

## 2012-04-09 MED ORDER — ZOLPIDEM TARTRATE 5 MG PO TABS: 5.0000 mg | ORAL_TABLET | Freq: Every evening | ORAL | Status: DC | PRN

## 2012-04-09 MED ORDER — ASPIRIN EC 81 MG PO TBEC
81.0000 mg | DELAYED_RELEASE_TABLET | Freq: Every day | ORAL | Status: DC
  Administered 2012-04-10 – 2012-04-14 (×5): 81 mg via ORAL
  Filled 2012-04-09 (×5): qty 1

## 2012-04-09 MED ORDER — ATORVASTATIN CALCIUM 40 MG PO TABS
40.0000 mg | ORAL_TABLET | Freq: Every day | ORAL | Status: DC
  Administered 2012-04-09 – 2012-04-14 (×6): 40 mg via ORAL
  Filled 2012-04-09 (×6): qty 1

## 2012-04-09 MED ORDER — HEPARIN (PORCINE) IN NACL 100-0.45 UNIT/ML-% IJ SOLN
650.0000 [IU]/h | INTRAMUSCULAR | Status: DC
  Filled 2012-04-09: qty 250

## 2012-04-09 MED ORDER — NITROGLYCERIN 0.4 MG SL SUBL: 0.4000 mg | SUBLINGUAL_TABLET | SUBLINGUAL | Status: DC | PRN

## 2012-04-09 MED ORDER — NITROGLYCERIN IN D5W 200-5 MCG/ML-% IV SOLN
5.0000 ug/min | INTRAVENOUS | Status: DC
  Administered 2012-04-09: 5 ug/min via INTRAVENOUS

## 2012-04-09 MED ORDER — CALCIUM CARBONATE-VITAMIN D 500-200 MG-UNIT PO TABS
1.0000 | ORAL_TABLET | Freq: Every day | ORAL | Status: DC
  Administered 2012-04-09 – 2012-04-14 (×6): 1 via ORAL
  Filled 2012-04-09 (×6): qty 1

## 2012-04-09 MED ORDER — AMLODIPINE BESYLATE 5 MG PO TABS
5.0000 mg | ORAL_TABLET | Freq: Every day | ORAL | Status: DC
  Administered 2012-04-09 – 2012-04-12 (×4): 5 mg via ORAL
  Filled 2012-04-09 (×4): qty 1

## 2012-04-09 MED ORDER — INSULIN GLARGINE 100 UNIT/ML ~~LOC~~ SOLN
20.0000 [IU] | Freq: Two times a day (BID) | SUBCUTANEOUS | Status: DC
  Administered 2012-04-09 – 2012-04-10 (×2): 20 [IU] via SUBCUTANEOUS

## 2012-04-09 MED ORDER — SODIUM CHLORIDE 0.9 % IV SOLN
INTRAVENOUS | Status: DC
  Administered 2012-04-09: 50 mL/h via INTRAVENOUS
  Administered 2012-04-11: via INTRAVENOUS

## 2012-04-09 MED ORDER — ACETAMINOPHEN 325 MG PO TABS: 650.0000 mg | ORAL_TABLET | ORAL | Status: DC | PRN

## 2012-04-09 MED ORDER — CLOPIDOGREL BISULFATE 75 MG PO TABS
75.0000 mg | ORAL_TABLET | Freq: Every day | ORAL | Status: DC
  Administered 2012-04-09 – 2012-04-14 (×6): 75 mg via ORAL
  Filled 2012-04-09 (×6): qty 1

## 2012-04-09 NOTE — Plan of Care (Signed)
Problem: Consults Goal: Cardiac Cath Patient Education (See Patient Education module for education specifics.)  Outcome: Completed/Met Date Met:  04/09/12 Cath video watched and questions answered. Pt acknowledges understanding of cath procedure.

## 2012-04-09 NOTE — H&P (Signed)
Catherine Clayton is an 78 y.o. female.    Cardiologist: Dr. Allyson Sabal  Chief Complaint: chest pain HPI: 78 year old white widowed female presented to [facility] this morning after waking at 2 AM with chest pain initially she stated she woke up to go the bathroom but then developed midsternal tightness pressure with tingling in both arms she had dizziness but no nausea vomiting diaphoresis or shortness of breath. Her daughter was at her house, they gave her nitroglycerin sublingual and 325 mg of aspirin with improvement and finally resolution of her chest pain. By this time she went to the emergency room and was found to be bradycardic with her chronic left bundle branch block. She was then transported to [facility] for further evaluation.  Currently she is pain-free, EKG  sinus bradycardia in the 40s with frequent PVCs and short bursts of nonsustained ventricular tachycardia.  She has chronic left bundle branch block.  Cardiac history includes coronary bypass grafting in 2000 and a non-ST elevation MI in February of 2010 with an occluded LAD and graft, patent LIMA to the LAD, patent vein graft to the diagonal branch and PDA. She did have high-grade ramus branch disease which had been not bypassed and underwent PCI to this vessel by Dr. Newt Lukes.  Other history does include peripheral vascular disease with angioplasty and stent to her left SFA with occlusion by subsequent Dopplers. She also had an occluded right femoral artery and underwent recanalization and scant in April 2012 increasing her ABI from 0.49 tp 0.9.  In October 2000 1210 nonsustained ventricular tachycardia secondary to hypokalemia and hypomagnesemia. EF at that time was 40-45% a followup Myoview in the same timeframe was nonischemic.  In April of 2013 she had a small cerebellar 6 mm hemorrhage per CT at [facility] but was not seen on repeated MRI or CT at [facility] after admission. At that time she continued on Plavix but her aspirin  was DC'd.    Past Medical History  Diagnosis Date  . Hypertension   . Coronary artery disease     Cabg x 4  . Myocardial infarction   . Angina   . Atrial fibrillation   . Diabetes mellitus     insulin dependent  . Shortness of breath   . Hypothyroidism   . GERD (gastroesophageal reflux disease)   . Arthritis     Bilateral arms  . Anxiety   . Hyperlipemia   . Peripheral vascular disease     R Femerol artery stent  . Ovarian cancer   . Rectal cancer   . NSTEMI (non-ST elevated myocardial infarction) 04/09/2012  . Bradycardia 04/09/2012  . DM (diabetes mellitus) 04/09/2012  . CAD (coronary artery disease) of artery bypass graft, occluded OM graft, patent LIMA to LAD, VG tp diag and PDA 04/09/2012    Past Surgical History  Procedure Date  . Coronary artery bypass graft     x 4 1995  . Abdominal hysterectomy   . Tonsillectomy   . Appendectomy   . Back surgery     thoracic "cementing"  . Vascular surgery 01/26/2011    Common femoral stent  . Cardiac catheterization     Family History  Problem Relation Age of Onset  . Breast cancer Sister   . Coronary artery disease Sister   . Alzheimer's disease Sister   . Stroke Brother   . Stroke Sister   . Hypertension Son    Social History:  reports that she has never smoked. She has never  used smokeless tobacco. She reports that she does not drink alcohol or use illicit drugs. She is widowed with 2 children 4 grandchildren. Her daughter states at her home frequently to assist her mother. Physical therapy has also been coming to assist her with ambulation  Allergies:  Allergies  Allergen Reactions  . Influenza Vaccines Swelling  . Demerol Nausea And Vomiting  . Promethazine Other (See Comments)    Pt. Unsure  . Zithromax (Azithromycin) Nausea And Vomiting  . Penicillins Rash    Outpatient medications: Lantus insulin 40 units subcutaneous daily Humalog insulin 15 units as needed for glucose greater than 150 Synthroid 75 mg  daily Coreg 6.25 mg twice a day Furosemide 20 mg daily Plavix 75 mg daily AcipHex 20 mg twice a day Protonix 20 mg with supper Lipitor 40 mg daily Neurontin 100 mg at bedtime Amlodipine 5 mg daily Zofran 25 mg as needed for nausea Ambien 10 mg as needed Calcium with vitamin D 600 mg daily Centrum Silver multivitamin daily next on magnesium oxide 400 mg daily Klor-Con tablets 20 mEq daily Nitroglycerin sublingual when necessary Aspirin was stopped in April due to a small bleed.    ROS: General:No lightheadedness no dizziness no cold symptoms, no weight changes Skin:Her rashes no Ulcers HEENT:No blurred vision no double vision CV:No chest pain no palpitations PUL:The shortness of breath no wheezes GI:No diarrhea constipation or melena GU:No hematuria or dysuria MS:No joint pain or back pain Neuro:No syncope Endo:She is diabetic and on insulin. She is also hypothyroid and treated   Blood pressure 136/50, pulse 53, temperature 98.5 F (36.9 C), temperature source Oral, resp. rate 18, height 5\' 6"  (1.676 m), weight 64 kg (141 lb 1.5 oz), SpO2 100.00%. PE: General:Alert oriented white female in no acute distress pleasant affect Skin:Warm and dry brisk capillary refill HEENT:Normocephalic sclera clear Neck:Supple negative JVD no carotid bruit Heart:S1-S2 somewhat irregular with premature beats, no obvious murmur currently Lungs:Clear without rales rhonchi or wheezes ZOX:[id number] bowel sounds soft nontender No masses palpated Ext:No edema 1+ posterior tib pulse Neuro:Alert and oriented x3 follows commands moves all extremities    Assessment/Plan Patient Active Problem List  Diagnosis  . Hypertension  . CABG 2000. PCI 2/10, EF 40-45% by Myoview 10/12, (low risk)  . PAF. NSR on admission  . PVD, RFA PTA 2/12  . Hyperlipemia  . CVA, Rt brain  . NSVT, 33bts in setting of hypokalemia, (3.1) on admission  . LBBB (left bundle branch block)  . Hypokalemia  . NSTEMI (non-ST  elevated myocardial infarction)  . Bradycardia  . DM (diabetes mellitus)  . CAD (coronary artery disease) of artery bypass graft, occluded OM graft, patent LIMA to LAD, VG tp diag and PDA  . Hx of heart artery stent, to ramus (vessel was not bypassed) 2010   PLAN: IV heparin and NTG, cardiac enzymes, and prob. Cath in  AM, depending on Cr.  Hold BB due to heart rate.  INGOLD,LAURA R 04/09/2012, 1:09 PM   Patient seen and examined. Agree with assessment and plan. Very pleasant 78 yo WF pt of Dr. Allyson Sabal. She is s/p CABG in 2000, and had documented occluded graft to OM in 11/2008 at which time she underwent PCI to an unbypassed RI vessel.  She has PVD, s/p stenting LE with documented occluded LSFA. She is s/p recent cerebellar bleed in 01/2012.  She now presents in transfer from [facility] ER after awakening from sleep with SSCP. Trop is mildly positive suggesting NSTEMI. ECG nondiagnostic due to  LBBB. Pt is currently pain free. Will anticoagulate, treat with nitrates. Will need repeat cath possible tomorrow or Tuesday. Gently hydrate with renal insufficiency.  Lennette Bihari, MD, [facility] 04/09/2012 1:14 PM

## 2012-04-09 NOTE — Progress Notes (Signed)
ANTICOAGULATION CONSULT NOTE - Follow Up Consult  Pharmacy Consult for Heparin Indication: chest pain/ACS  Allergies  Allergen Reactions  . Influenza Vaccines Swelling  . Demerol Nausea And Vomiting  . Promethazine Other (See Comments)    Pt. Unsure  . Zithromax (Azithromycin) Nausea And Vomiting  . Penicillins Rash    Patient Measurements: Height: 5\' 6"  (167.6 cm) Weight: 141 lb 1.5 oz (64 kg) IBW/kg (Calculated) : 59.3  Heparin Dosing Weight: 64 kg  Vital Signs: Temp: 98.4 F (36.9 C) (07/07 1600) Temp src: Oral (07/07 1600) BP: 116/83 mmHg (07/07 1700) Pulse Rate: 50  (07/07 1700)  Labs:  Basename 04/09/12 1630 04/09/12 1440  HGB -- --  HCT -- --  PLT -- --  APTT -- --  LABPROT -- --  INR -- --  HEPARINUNFRC 1.32* --  CREATININE -- --  CKTOTAL -- 77  CKMB -- 7.6*  TROPONINI -- 1.59*    Estimated Creatinine Clearance: 27.3 ml/min (by C-G formula based on Cr of 1.41).   Medications:  Infusions:    . sodium chloride 50 mL/hr (04/09/12 1557)  . heparin 850 Units/hr (04/09/12 1557)  . nitroGLYCERIN 5 mcg/min (04/09/12 1557)    Assessment: 78 yr female transferred from [facility] with chest pain. Heparin was started at 9 AM at [facility] with bolus of 4000 units x 1, then gtt running at 850 units/hr.  Baseline labs from [place]: WBC 5.2 Hgb 10; Hct 36.9; Pltc 126. PT/INR = 11.1/1.1. No chronic anticoagulation noted. Scr 1.6; Estimated CrCl ~ 30 ml/min.   Noted hx of small cerebellar hemorrhage 01/2012. Will try to target lower end of therapeutic heparin range.   Heparin level is supratherapeutic at 1.23. Heparin level was drawn from opposite arm. Also confirmed with RN the pump rate was correct. Heparin was stopped before I got up to room to verify pump settings (off ~18:15). No bleeding noted.  Goal of Therapy:  Heparin level 0.3-0.7 units/ml Monitor platelets by anticoagulation protocol: Yes   Plan:  1. Hold heparin for 1 hour 2. At 19:30,  resume heparin drip at 650 units/hr 3. Heparin level and CBC 8 hours after restarted 4. Daily heparin level and CBC while on heparin   [facility], [address].D., BCPS Clinical Pharmacist Pager: [phone] 04/09/2012 6:31 PM

## 2012-04-09 NOTE — Progress Notes (Signed)
CRITICAL VALUE ALERT  Critical value received:  CKMB 7.6 and Troponin 1.5  Date of notification:  04/09/2012  Time of notification:  1545  Critical value read back:yes  Nurse who received alert:  Malachy Chamber  MD notified (1st page):  Nada Boozer, NP  Time of first page:  [phone]  MD notified (2nd page):  Time of second page:  Responding MD:   Time MD responded:

## 2012-04-09 NOTE — Progress Notes (Addendum)
ANTICOAGULATION CONSULT NOTE - Initial Consult  Pharmacy Consult for IV heparin Indication: chest pain/ACS  Allergies  Allergen Reactions  . Influenza Vaccines Swelling  . Demerol Nausea And Vomiting  . Promethazine Other (See Comments)    Pt. Unsure  . Zithromax (Azithromycin) Nausea And Vomiting  . Penicillins Rash    Patient Measurements: Height: 5\' 6"  (167.6 cm) Weight: 141 lb 1.5 oz (64 kg) IBW/kg (Calculated) : 59.3  Heparin Dosing Weight: 64 kg  Vital Signs: Temp: 98.5 F (36.9 C) (07/07 1100) Temp src: Oral (07/07 1100) BP: 136/50 mmHg (07/07 1100) Pulse Rate: 53  (07/07 1100)  Labs: No results found for this basename: HGB:2,HCT:3,PLT:3,APTT:3,LABPROT:3,INR:3,HEPARINUNFRC:3,CREATININE:3,CKTOTAL:3,CKMB:3,TROPONINI:3 in the last 72 hours  Estimated Creatinine Clearance: 27.3 ml/min (by C-G formula based on Cr of 1.41).   Medical History: Past Medical History  Diagnosis Date  . Hypertension   . Coronary artery disease     Cabg x 4  . Myocardial infarction   . Angina   . Atrial fibrillation   . Diabetes mellitus     insulin dependent  . Shortness of breath   . Hypothyroidism   . GERD (gastroesophageal reflux disease)   . Arthritis     Bilateral arms  . Anxiety   . Hyperlipemia   . Peripheral vascular disease     R Femerol artery stent  . Ovarian cancer   . Rectal cancer   . NSTEMI (non-ST elevated myocardial infarction) 04/09/2012  . Bradycardia 04/09/2012  . DM (diabetes mellitus) 04/09/2012  . CAD (coronary artery disease) of artery bypass graft, occluded OM graft, patent LIMA to LAD, VG tp diag and PDA 04/09/2012    Medications:  Scheduled:    . amLODipine  5 mg Oral Daily  . aspirin EC  81 mg Oral Daily  . atorvastatin  40 mg Oral Daily  . calcium-vitamin D  1 tablet Oral Daily  . clopidogrel  75 mg Oral Daily  . gabapentin  100 mg Oral QHS  . insulin aspart  0-9 Units Subcutaneous TID WC  . insulin glargine  20 Units Subcutaneous BID  .  pantoprazole  40 mg Oral QHS    Assessment: 78 yr female transferred from [facility] with chest pain.  Heparin was started at 9 AM at [facility] with bolus of 4000 units x 1, then gtt running at 850 units/hr. Baseline labs from [place]: WBC 5.2 Hgb 10; Hct 36.9; Pltc 126.  PT/INR = 11.1/1.1.  No chronic anticoagulation noted.  Scr 1.6; Estimated CrCl ~ 30 ml/min.  Noted hx of small cerebellar hemorrhage 01/2012.  Will try to target lower end of therapeutic heparin range.  Goal of Therapy:  Heparin level 0.3-0.7 units/ml Monitor platelets by anticoagulation protocol: Yes   Plan:  1. Continue IV heparin at current rate of 850 units/hr. 2. Check a heparin level 8 hrs after start of gtt. 3. Daily heparin level and CBC, 4. F/U plans for cath Tuesday.  Reece Leader, Pharm D 04/09/2012 2:17 PM    Shelby Anderle, Gwenlyn Found 04/09/2012,2:17 PM

## 2012-04-10 ENCOUNTER — Encounter (HOSPITAL_COMMUNITY)
Admission: AD | Disposition: A | Discharge status: Home / Self Care | Payer: Self-pay | Source: Other Acute Inpatient Hospital | Attending: Cardiovascular Disease

## 2012-04-10 HISTORY — PX: CARDIAC CATHETERIZATION: SHX172

## 2012-04-10 HISTORY — PX: LEFT HEART CATHETERIZATION WITH CORONARY/GRAFT ANGIOGRAM: SHX5450

## 2012-04-10 LAB — GLUCOSE, CAPILLARY
Glucose-Capillary: 94 mg/dL (ref 70–99)
Glucose-Capillary: 96 mg/dL (ref 70–99)
Glucose-Capillary: 74 mg/dL (ref 70–99)

## 2012-04-10 LAB — BASIC METABOLIC PANEL
CO2: 20 mEq/L (ref 19–32)
GFR calc non Af Amer: 38 mL/min — ABNORMAL LOW (ref >90)
Glucose, Bld: 50 mg/dL — ABNORMAL LOW (ref 70–99)
Potassium: 3.9 mEq/L (ref 3.5–5.1)
Sodium: 142 mEq/L (ref 135–145)

## 2012-04-10 LAB — LIPID PANEL
LDL Cholesterol: 64 mg/dL (ref 0–99)
Total CHOL/HDL Ratio: 2.6 RATIO
VLDL: 13 mg/dL (ref 0–40)

## 2012-04-10 LAB — CARDIAC PANEL(CRET KIN+CKTOT+MB+TROPI)
CK, MB: 3.9 ng/mL (ref 0.3–4.0)
Relative Index: INVALID (ref 0.0–2.5)
Total CK: 50 U/L (ref 7–177)
Troponin I: 0.71 ng/mL (ref <0.30)

## 2012-04-10 LAB — PROTIME-INR
INR: 1.08 (ref 0.00–1.49)
Prothrombin Time: 14.2 seconds (ref 11.6–15.2)

## 2012-04-10 LAB — POCT ACTIVATED CLOTTING TIME: Activated Clotting Time: 139 seconds

## 2012-04-10 LAB — T4, FREE: Free T4: 1.29 ng/dL (ref 0.80–1.80)

## 2012-04-10 SURGERY — LEFT HEART CATHETERIZATION WITH CORONARY/GRAFT ANGIOGRAM

## 2012-04-10 MED ORDER — TRAMADOL HCL 50 MG PO TABS
50.0000 mg | ORAL_TABLET | Freq: Four times a day (QID) | ORAL | Status: DC | PRN
  Filled 2012-04-10: qty 1

## 2012-04-10 MED ORDER — ACETAMINOPHEN 325 MG PO TABS: 650.0000 mg | ORAL_TABLET | ORAL | Status: DC | PRN

## 2012-04-10 MED ORDER — MIDAZOLAM HCL 2 MG/2ML IJ SOLN
INTRAMUSCULAR | Status: AC
  Filled 2012-04-10: qty 2

## 2012-04-10 MED ORDER — HEPARIN (PORCINE) IN NACL 2-0.9 UNIT/ML-% IJ SOLN
INTRAMUSCULAR | Status: AC
  Filled 2012-04-10: qty 2000

## 2012-04-10 MED ORDER — FENTANYL CITRATE 0.05 MG/ML IJ SOLN
INTRAMUSCULAR | Status: AC
  Filled 2012-04-10: qty 2

## 2012-04-10 MED ORDER — ISOSORBIDE MONONITRATE ER 30 MG PO TB24
30.0000 mg | ORAL_TABLET | Freq: Every day | ORAL | Status: DC
  Administered 2012-04-10 – 2012-04-14 (×5): 30 mg via ORAL
  Filled 2012-04-10 (×5): qty 1

## 2012-04-10 MED ORDER — ALPRAZOLAM 0.25 MG PO TABS: 0.2500 mg | ORAL_TABLET | Freq: Three times a day (TID) | ORAL | Status: DC | PRN

## 2012-04-10 MED ORDER — ONDANSETRON HCL 4 MG/2ML IJ SOLN: 4.0000 mg | Freq: Four times a day (QID) | INTRAMUSCULAR | Status: DC | PRN

## 2012-04-10 MED ORDER — HEPARIN (PORCINE) IN NACL 100-0.45 UNIT/ML-% IJ SOLN
500.0000 [IU]/h | INTRAMUSCULAR | Status: DC
  Administered 2012-04-10: 500 [IU]/h via INTRAVENOUS
  Filled 2012-04-10: qty 250

## 2012-04-10 MED ORDER — SODIUM CHLORIDE 0.9 % IV SOLN: INTRAVENOUS | Status: DC

## 2012-04-10 MED ORDER — NITROGLYCERIN 0.2 MG/ML ON CALL CATH LAB
INTRAVENOUS | Status: AC
  Filled 2012-04-10: qty 1

## 2012-04-10 MED ORDER — LIDOCAINE HCL (PF) 1 % IJ SOLN
INTRAMUSCULAR | Status: AC
  Filled 2012-04-10: qty 30

## 2012-04-10 MED FILL — Heparin Sodium (Porcine) 100 Unt/ML in Sodium Chloride 0.45%: INTRAMUSCULAR | Qty: 250 | Status: AC

## 2012-04-10 NOTE — Progress Notes (Signed)
[facility] Progress Note  Subjective:  No further chest pain.  Objective:   Vital Signs in the last 24 hours: Temp:  [97.6 F (36.4 C)-98.4 F (36.9 C)] 97.8 F (36.6 C) (07/08 1200) Pulse Rate:  [44-62] 55  (07/08 1300) Resp:  [10-20] 17  (07/08 1300) BP: (88-148)/(40-83) 132/55 mmHg (07/08 1300) SpO2:  [96 %-100 %] 99 % (07/08 1300) Weight:  [65.9 kg (145 lb 4.5 oz)] 65.9 kg (145 lb 4.5 oz) (07/08 0500)  Intake/Output from previous day: 07/07 0701 - 07/08 0700 In: 1456.5 [P.O.:340; I.V.:1116.5] Out: 700 [Urine:700]  Scheduled:   . amLODipine  5 mg Oral Daily  . aspirin EC  81 mg Oral Daily  . atorvastatin  40 mg Oral Daily  . calcium-vitamin D  1 tablet Oral Daily  . clopidogrel  75 mg Oral Daily  . gabapentin  100 mg Oral QHS  . insulin aspart  0-9 Units Subcutaneous TID WC  . insulin glargine  20 Units Subcutaneous BID  . pantoprazole  40 mg Oral QHS    Physical Exam:   General appearance: alert, cooperative and no distress Neck: no JVD Lungs: clear to auscultation bilaterally Heart: S1, S2 normal Abdomen: soft, non-tender; bowel sounds normal; no masses,  no organomegaly Pulses slightly more diminished on R vs LLE Extremities: extremities normal, atraumatic, no cyanosis or edema   Rate: 75  Rhythm: normal sinus rhythm  Lab Results:    Basename 04/10/12 0354  NA 142  K 3.9  CL 113*  CO2 20  GLUCOSE 50*  BUN 22  CREATININE 1.26*    Basename 04/10/12 0354 04/09/12 2006  TROPONINI 0.71* 0.75*   Hepatic Function Panel No results found for this basename: PROT,ALBUMIN,AST,ALT,ALKPHOS,BILITOT,BILIDIR,IBILI in the last 72 hours  Basename 04/10/12 0354  INR 1.08    Lipid Panel     Component Value Date/Time   CHOL 124 04/10/2012 0354   TRIG 65 04/10/2012 0354   HDL 47 04/10/2012 0354   CHOLHDL 2.6 04/10/2012 0354   VLDL 13 04/10/2012 0354   LDLCALC 64 04/10/2012 0354    Cardiac Panel (last 3 results)  Basename 04/10/12  0354 04/09/12 2006 04/09/12 1440  CKTOTAL 50 65 77  CKMB 3.9 5.9* 7.6*  TROPONINI 0.71* 0.75* 1.59*  RELINDX RELATIVE INDEX IS INVALID RELATIVE INDEX IS INVALID RELATIVE INDEX IS INVALID   Imaging:  No results found.    Assessment/Plan:   Principal Problem:  *NSTEMI (non-ST elevated myocardial infarction) Active Problems:  Hypertension  CABG 2000. PCI 2/10 to RI, EF 40-45% by Myoview 10/12, (low risk)  PAF. NSR on admission  PVD, RFA PTA 2/12  Hyperlipemia  CVA, Rt brain, 4/13, ASA stopped, Plavix continued  NSVT, 33bts in setting of hypokalemia, (3.1) on admission  LBBB (left bundle branch block)  Bradycardia  DM (diabetes mellitus)  Discussed with Dr. Allyson Sabal Plan cath today in light of positive enzymes. With reduced creatinine clearance if intervention is necessary most likely will stage due to contrast with graft study.    Lennette Bihari, MD, [facility] 04/10/2012, 2:12 PM

## 2012-04-10 NOTE — Progress Notes (Signed)
ANTICOAGULATION CONSULT NOTE - Follow Up Consult  Pharmacy Consult for heparin Indication: chest pain/ACS  Labs:  Basename 04/10/12 0354 04/09/12 2006 04/09/12 1630 04/09/12 1440  HGB -- -- -- --  HCT -- -- -- --  PLT -- -- -- --  APTT -- -- -- --  LABPROT 14.2 -- -- --  INR 1.08 -- -- --  HEPARINUNFRC 0.96* -- 1.32* --  CREATININE 1.26* -- -- --  CKTOTAL 50 65 -- 77  CKMB 3.9 5.9* -- 7.6*  TROPONINI 0.71* 0.75* -- 1.59*    Assessment: 78yo female remains supratherapeutic on heparin after rate decrease with goal to stay at low end of therapeutic range due to small ICH in April.  Add-on cath today, currently scheduled for noon.  Goal of Therapy:  Heparin level 0.3-0.7 units/ml   Plan:  Will hold heparin for then decrease heparin gtt to 500 units/hr and f/u after cath.  Colleen Can PharmD BCPS 04/10/2012,7:27 AM

## 2012-04-10 NOTE — Care Management Note (Unsigned)
    Page 1 of 2   04/12/2012     3:54:15 PM   CARE MANAGEMENT NOTE 04/12/2012  Patient:  Catherine Clayton, Catherine Clayton   Account Number:  [id number]  Date Initiated:  04/10/2012  Documentation initiated by:  Junius Creamer  Subjective/Objective Assessment:   adm w mi     Action/Plan:   lives alone, pcp dr Maisie Fus [place]   Anticipated DC Date:  04/13/2012   Anticipated DC Plan:  HOME W HOME HEALTH SERVICES      DC Planning Services  CM consult      [facility] Choice  HOME HEALTH   Choice offered to / List presented to:  C-1 Patient   DME arranged  Levan Hurst      DME agency  [facility]     [facility] arranged  HH-2 PT      [facility] agency  [facility]   Status of service:  In process, will continue to follow Medicare Important Message given?   (If response is "NO", the following Medicare IM given date fields will be blank) Date Medicare IM given:   Date Additional Medicare IM given:    Discharge Disposition:    Per UR Regulation:  Reviewed for med. necessity/level of care/duration of stay  If discussed at Long Length of Stay Meetings, dates discussed:    Comments:  04/12/12  1553  Coulson Wehner SIMMONS RN, BSN [phone] REFERRAL PLACED TO [facility] WITH [facility] FOR ROLATOR RW.    04/12/12  1439  Quamel Fitzmaurice SIMMONS RN, BSN [phone] REFERRAL PLACED TO [facility] FOR HHPT PER CHOICE;  INFORMATION FAXED; SOC DATE: WITHIN 24-48HRS POST DISCHARGE.  NP- PLEASE WRITE ORDER FOR ROLATOR RW.  THANKS  04/12/12  1041  Eagan Shifflett SIMMONS RN, BSN [phone] RECEIVED REFERRAL FOR HHPT; DISCUSSED DISCHARGE PLANNING WITH PT AND DAUGHTER IN LAW AT BEDSIDE AND PROVIDED A LIST OF HH PROVIDERS FOR REVIEW; THEY AGREED TO MAKE A SELECTION AND FOLLOW UP WITH NCM WITH CHOICE;  NCM WILL FOLLOW.   7/8 10:30a debbie dowell rn,bsn [phone]

## 2012-04-10 NOTE — Progress Notes (Signed)
Inpatient Diabetes Program Recommendations  AACE/ADA: New Consensus Statement on Inpatient Glycemic Control (2009)  Target Ranges:  Prepandial:   less than 140 mg/dL      Peak postprandial:   less than 180 mg/dL (1-2 hours)      Critically ill patients:  140 - 180 mg/dL  Results for Catherine Clayton, Catherine Clayton (MRN [id number]) as of 04/10/2012 11:09  Ref. Range 04/09/2012 17:50 04/09/2012 21:34 04/10/2012 06:38 04/10/2012 07:29  Glucose-Capillary Latest Range: 70-99 mg/dL 409 (H) 811 (H) 94 96   Inpatient Diabetes Program Recommendations Insulin - Basal: Lantus 20 units daily  Recommend 1/2 patient's home dose Lantus.  Patient received Lantus 20 units last PM and fasting CBG=94.  Patient is currently NPO. Will follow.  Thank you  Piedad Climes [facility] Inpatient Diabetes Coordinator [phone]

## 2012-04-10 NOTE — Progress Notes (Signed)
This mornings bmet showed glucose to be 50. Gave patient a cup of grape juice and rechecked blood sugar 15 minutes later and it came up to 94. Patient will be eating breakfast shortly.

## 2012-04-11 ENCOUNTER — Encounter (HOSPITAL_COMMUNITY): Payer: Self-pay | Admitting: Cardiology

## 2012-04-11 DIAGNOSIS — N183 Chronic kidney disease, stage 3 unspecified: Secondary | ICD-10-CM | POA: Diagnosis present

## 2012-04-11 HISTORY — DX: Chronic kidney disease, stage 3 unspecified: N18.30

## 2012-04-11 LAB — TSH: TSH: 0.441 u[IU]/mL (ref 0.350–4.500)

## 2012-04-11 LAB — CBC
HCT: 27.8 % — ABNORMAL LOW (ref 36.0–46.0)
Hemoglobin: 8.9 g/dL — ABNORMAL LOW (ref 12.0–15.0)
MCH: 28.6 pg (ref 26.0–34.0)
MCHC: 32 g/dL (ref 30.0–36.0)
MCV: 89.4 fL (ref 78.0–100.0)
Platelets: 123 10*3/uL — ABNORMAL LOW (ref 150–400)
RBC: 3.11 MIL/uL — ABNORMAL LOW (ref 3.87–5.11)
RDW: 13.3 % (ref 11.5–15.5)
WBC: 4.7 10*3/uL (ref 4.0–10.5)

## 2012-04-11 LAB — GLUCOSE, CAPILLARY
Glucose-Capillary: 96 mg/dL (ref 70–99)
Glucose-Capillary: 178 mg/dL — ABNORMAL HIGH (ref 70–99)

## 2012-04-11 LAB — BASIC METABOLIC PANEL
CO2: 19 mEq/L (ref 19–32)
Calcium: 8.3 mg/dL — ABNORMAL LOW (ref 8.4–10.5)
Chloride: 114 mEq/L — ABNORMAL HIGH (ref 96–112)
Sodium: 142 mEq/L (ref 135–145)

## 2012-04-11 MED ORDER — GLIPIZIDE 2.5 MG HALF TABLET
2.5000 mg | ORAL_TABLET | Freq: Every day | ORAL | Status: DC
  Administered 2012-04-12 – 2012-04-14 (×3): 2.5 mg via ORAL
  Filled 2012-04-11 (×4): qty 1

## 2012-04-11 MED ORDER — ENOXAPARIN SODIUM 30 MG/0.3ML ~~LOC~~ SOLN
30.0000 mg | SUBCUTANEOUS | Status: DC
  Administered 2012-04-11 – 2012-04-13 (×3): 30 mg via SUBCUTANEOUS
  Filled 2012-04-11 (×4): qty 0.3

## 2012-04-11 NOTE — Evaluation (Signed)
Physical Therapy Evaluation Patient Details Name: Catherine Clayton MRN: [id number] DOB: 1925/04/02 Today's Date: 04/11/2012 Time: 0865-7846 PT Time Calculation (min): 23 min  PT Assessment / Plan / Recommendation Clinical Impression  pt adm with chest pain and has suffered an NSTEMI of unknown origin.  Presently, pt is a little weakened and can benefit from ST HHPT to get her stregthened up and safer to be home alone    PT Assessment  All further PT needs can be met in the next venue of care    Follow Up Recommendations  Home health PT;Supervision - Intermittent;Other (comment) (extra Supervision 1st day until family is confident)    Barriers to Discharge        Equipment Recommendations  None recommended by PT    Recommendations for Other Services     Frequency      Precautions / Restrictions Restrictions Weight Bearing Restrictions: No   Pertinent Vitals/Pain       Mobility  Bed Mobility Bed Mobility: Supine to Sit;Sitting - Scoot to Edge of Bed Supine to Sit: 7: Independent Sitting - Scoot to [place] of Bed: 7: Independent Transfers Transfers: Sit to Stand;Stand to Sit Sit to Stand: 5: Supervision Stand to Sit: 5: Supervision Details for Transfer Assistance: stand by for safety only Ambulation/Gait Ambulation/Gait Assistance: 5: Supervision Ambulation Distance (Feet): 180 Feet Assistive device: None Ambulation/Gait Assistance Details: steady gait if a bit stiff Gait Pattern: Within Functional Limits;Decreased stride length (mildly flexed posture) Stairs: Yes Stairs Assistance: 4: Min assist Stairs Assistance Details (indicate cue type and reason): vc for sequencing for safety Stair Management Technique: One rail Right;Alternating pattern;Forwards (should have used step to for weaker R Le) Number of Stairs: 4     Exercises     PT Diagnosis: Generalized weakness  PT Problem List: Decreased strength;Decreased activity tolerance;Decreased balance PT Treatment  Interventions:     PT Goals    Visit Information  Last PT Received On: 04/11/12 Assistance Needed: +1    Subjective Data  Subjective: Thank you, you've made my day Patient Stated Goal: Home I   Prior Functioning  Home Living Lives With: Alone Available Help at Discharge: Family Type of Home: House Home Access: Level entry Home Layout: One level Bathroom Shower/Tub: Walk-in Contractor: Standard Home Adaptive Equipment: Straight cane Prior Function Level of Independence: Independent Able to Take Stairs?: Yes Driving: Yes Communication Communication: No difficulties    Cognition  Overall Cognitive Status: Appears within functional limits for tasks assessed/performed Arousal/Alertness: Awake/alert Orientation Level: Appears intact for tasks assessed Behavior During Session: [facility] for tasks performed    Extremity/Trunk Assessment Right Upper Extremity Assessment RUE ROM/Strength/Tone: Within functional levels Left Upper Extremity Assessment LUE ROM/Strength/Tone: Within functional levels Right Lower Extremity Assessment RLE ROM/Strength/Tone: Within functional levels Left Lower Extremity Assessment LLE ROM/Strength/Tone: Within functional levels Trunk Assessment Trunk Assessment: Normal   Balance    End of Session PT - End of Session Equipment Utilized During Treatment: Gait belt Activity Tolerance: Patient tolerated treatment well Patient left: in bed;with call bell/phone within reach Nurse Communication: Mobility status  GP     Catherine Clayton, Eliseo Gum 04/11/2012, 5:18 PM  04/11/2012  [place], PT [phone] (pager)

## 2012-04-11 NOTE — Progress Notes (Signed)
Subjective:  No chest pain  Objective:  Vital Signs in the last 24 hours: Temp:  [97.8 F (36.6 C)-98.8 F (37.1 C)] 98.7 F (37.1 C) (07/09 0400) Pulse Rate:  [44-66] 55  (07/09 0700) Resp:  [0-24] 16  (07/09 0700) BP: (103-154)/(38-70) 111/38 mmHg (07/09 0700) SpO2:  [93 %-99 %] 93 % (07/09 0700) Weight:  [67.6 kg (149 lb 0.5 oz)] 67.6 kg (149 lb 0.5 oz) (07/09 0500)  Intake/Output from previous day:  Intake/Output Summary (Last 24 hours) at 04/11/12 0821 Last data filed at 04/11/12 0700  Gross per 24 hour  Intake 1928.75 ml  Output    850 ml  Net 1078.75 ml    Physical Exam: General appearance: alert, cooperative and no distress Neck: bilat carotid bruits Lungs: clear to auscultation bilaterally Heart: regular rate and rhythm Rt groin without hematoma   Rate: 56  Rhythm: sinus bradycardia  Lab Results:  Basename 04/11/12 0648  WBC 4.7  HGB 8.9*  PLT 123*    Basename 04/11/12 0648 04/10/12 0354  NA 142 142  K 3.8 3.9  CL 114* 113*  CO2 19 20  GLUCOSE 103* 50*  BUN 20 22  CREATININE 1.34* 1.26*    Basename 04/10/12 0354 04/09/12 2006  TROPONINI 0.71* 0.75*   Hepatic Function Panel No results found for this basename: PROT,ALBUMIN,AST,ALT,ALKPHOS,BILITOT,BILIDIR,IBILI in the last 72 hours  Basename 04/10/12 0354  CHOL 124    Basename 04/10/12 0354  INR 1.08    Imaging: Imaging results have been reviewed  Cardiac Studies:  Assessment/Plan:   Principal Problem:  *NSTEMI, Troponin 1.59 Active Problems:  CABG 2000. PCI 2/10 to RI, cath 04/10/12- medical Rx. 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 NSR on admission  PVD, RFA PTA 2/12  Hyperlipemia  LBBB (left bundle branch block)  Bradycardia  DM (diabetes mellitus)   Plan- BS have been low, will stop Lantus and try an oral agent. Tx  telemetry, mobilize. Hold ACE with renal insuf.   Corine Shelter PA-C 04/11/2012, 8:21 AM    Agree with note written by Corine Shelter [facility]  S/P cath revealing patent grafts and stent with nl LV fxn. Mild apical hypo. Etiology of NSTEMI not clear but no culprit vessel. Off heparin. No CP. Exam benign. Groin OK. Plan transfer to tele. Ambulate. Prob home in am.  Runell Gess 04/11/2012 8:25 AM

## 2012-04-12 ENCOUNTER — Encounter (HOSPITAL_COMMUNITY): Payer: Self-pay | Admitting: Cardiology

## 2012-04-12 LAB — CBC
HCT: 27.9 % — ABNORMAL LOW (ref 36.0–46.0)
Hemoglobin: 9 g/dL — ABNORMAL LOW (ref 12.0–15.0)
MCH: 28.8 pg (ref 26.0–34.0)
MCHC: 32.3 g/dL (ref 30.0–36.0)
MCV: 89.4 fL (ref 78.0–100.0)
Platelets: 120 10*3/uL — ABNORMAL LOW (ref 150–400)
RBC: 3.12 MIL/uL — ABNORMAL LOW (ref 3.87–5.11)
RDW: 13.4 % (ref 11.5–15.5)
WBC: 4.9 10*3/uL (ref 4.0–10.5)

## 2012-04-12 LAB — BASIC METABOLIC PANEL
BUN: 22 mg/dL (ref 6–23)
CO2: 21 mEq/L (ref 19–32)
Calcium: 8.7 mg/dL (ref 8.4–10.5)
Chloride: 113 mEq/L — ABNORMAL HIGH (ref 96–112)
Creatinine, Ser: 1.35 mg/dL — ABNORMAL HIGH (ref 0.50–1.10)
GFR calc Af Amer: 40 mL/min — ABNORMAL LOW (ref >90)
GFR calc non Af Amer: 35 mL/min — ABNORMAL LOW (ref >90)
Glucose, Bld: 108 mg/dL — ABNORMAL HIGH (ref 70–99)
Potassium: 3.7 mEq/L (ref 3.5–5.1)
Sodium: 143 mEq/L (ref 135–145)

## 2012-04-12 LAB — GLUCOSE, CAPILLARY: Glucose-Capillary: 136 mg/dL — ABNORMAL HIGH (ref 70–99)

## 2012-04-12 MED ORDER — CARVEDILOL 3.125 MG PO TABS
3.1250 mg | ORAL_TABLET | Freq: Two times a day (BID) | ORAL | Status: DC
  Filled 2012-04-12 (×2): qty 1

## 2012-04-12 MED ORDER — CARVEDILOL 3.125 MG PO TABS: 3.1250 mg | ORAL_TABLET | Freq: Two times a day (BID) | ORAL | Status: DC

## 2012-04-12 MED ORDER — INSULIN GLARGINE 100 UNIT/ML ~~LOC~~ SOLN: 20.0000 [IU] | Freq: Every day | SUBCUTANEOUS | Status: DC

## 2012-04-12 MED ORDER — ISOSORBIDE MONONITRATE ER 30 MG PO TB24: 30.0000 mg | ORAL_TABLET | Freq: Every day | ORAL | Status: DC

## 2012-04-12 MED ORDER — ASPIRIN 81 MG PO TBEC: 81.0000 mg | DELAYED_RELEASE_TABLET | Freq: Every day | ORAL | Status: AC

## 2012-04-12 MED ORDER — NITROGLYCERIN 0.4 MG SL SUBL: 0.4000 mg | SUBLINGUAL_TABLET | SUBLINGUAL | Status: AC | PRN

## 2012-04-12 MED ORDER — AMLODIPINE BESYLATE 10 MG PO TABS
10.0000 mg | ORAL_TABLET | Freq: Every day | ORAL | Status: DC
  Administered 2012-04-13 – 2012-04-14 (×2): 10 mg via ORAL
  Filled 2012-04-12 (×2): qty 1

## 2012-04-12 MED ORDER — AMLODIPINE BESYLATE 10 MG PO TABS: 10.0000 mg | ORAL_TABLET | Freq: Every day | ORAL | Status: DC

## 2012-04-12 MED ORDER — CARVEDILOL 3.125 MG PO TABS
3.1250 mg | ORAL_TABLET | Freq: Two times a day (BID) | ORAL | Status: DC
  Administered 2012-04-12 – 2012-04-13 (×2): 3.125 mg via ORAL
  Filled 2012-04-12 (×4): qty 1

## 2012-04-12 NOTE — Progress Notes (Signed)
Subjective: No complaints---Freq PVCs on tele.  Coreg from home wa d/c'd due to significant brady with hr in 40's  Objective: Vital signs in last 24 hours: Temp:  [97.6 F (36.4 C)-98.9 F (37.2 C)] 98.7 F (37.1 C) (07/10 0622) Pulse Rate:  [46-70] 70  (07/10 0622) Resp:  [12-19] 17  (07/10 0622) BP: (113-133)/(38-72) 133/72 mmHg (07/10 0622) SpO2:  [91 %-98 %] 95 % (07/10 0622) Weight:  [67.6 kg (149 lb 0.5 oz)] 67.6 kg (149 lb 0.5 oz) (07/10 0622) Weight change: 0 kg (0 lb) Last BM Date: 04/10/12 Intake/Output from previous day: +1330 07/09 0701 - 07/10 0700 In: 360 [P.O.:360] Out: 300 [Urine:300] Intake/Output this shift:    PE: General:alert and oriented, no complaints Heart:S1S2 RRR with occ premature beats Lungs:clear without rales, rhonchi or wheezes Abd:+ bs soft non tender Ext:no edema,rt. groin cath site without hematoma   Lab Results:  [facility] 04/12/12 0520 04/11/12 0648  WBC 4.9 4.7  HGB 9.0* 8.9*  HCT 27.9* 27.8*  PLT 120* 123*   BMET  Basename 04/12/12 0520 04/11/12 0648  NA 143 142  K 3.7 3.8  CL 113* 114*  CO2 21 19  GLUCOSE 108* 103*  BUN 22 20  CREATININE 1.35* 1.34*  CALCIUM 8.7 8.3*    Basename 04/10/12 0354 04/09/12 2006  TROPONINI 0.71* 0.75*    Lab Results  Component Value Date   CHOL 124 04/10/2012   HDL 47 04/10/2012   LDLCALC 64 04/10/2012   TRIG 65 04/10/2012   CHOLHDL 2.6 04/10/2012   Lab Results  Component Value Date   HGBA1C 7.1* 04/09/2012     Lab Results  Component Value Date   TSH 0.441 04/11/2012  Studies/Results: No results found.  No studies performed.  Medications: I have reviewed the patient's current medications.    Marland Kitchen amLODipine  5 mg Oral Daily  . aspirin EC  81 mg Oral Daily  . atorvastatin  40 mg Oral Daily  . calcium-vitamin D  1 tablet Oral Daily  . clopidogrel  75 mg Oral Daily  . enoxaparin (LOVENOX) injection  30 mg Subcutaneous Q24H  . gabapentin  100 mg Oral QHS  . glipiZIDE  2.5 mg Oral QAC  breakfast  . insulin aspart  0-9 Units Subcutaneous TID WC  . isosorbide mononitrate  30 mg Oral Daily  . pantoprazole  40 mg Oral QHS  . DISCONTD: insulin glargine  20 Units Subcutaneous BID   Assessment/Plan: Principal Problem:   *NSTEMI, Troponin 1.59 -- no culprit lesion of cardiac catheterization. Active Problems:   CABG 2000. PCI 2/10 to RI, cath 04/10/12- medical Rx. EF 50-55%   Hypertension   PAF. 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  Wt is up 3 kg since admit  Bradycardia- hr in 60's off BB but freq PVCs  ? Resume low dose BB was on 12.5 coreg BID. Home Health PT   LOS: 3 days   INGOLD,LAURA R 04/12/2012, 7:54 AM  ATTENDING ATTESTATION:  I have seen and examined the patient along with Nada Boozer, NP.  I have  reviewed the chart, notes and new data.  I agree with Luara's note.  Brief Description: 78 y/o woman with complex PMH as noted above p/w SSx of ACS/Unstable angina with + Troponin c/w NSTEMI.  Also bradycardia with LBBB.  Underwent LCP - with no culprit lesion found. ? If she could have had some endocardial ischemia with bradycardia and diastolic dysfunction.  BNP was up on admission & not rechecked.  Will recheck today -- lungs without rales on exam. CKD III with stable Cr.     Key new complaints: none today  Key examination changes: I agree with detailed exam above  Key new findings / data: No CXR with BNP ~2000 on admission.  PLAN:  With h/o PAF & couplets noted on Tele - will restart low dose BB - 3.125 mg of Coreg  ? If there was a potential coronary spasm  component of ACS - increase Amlodipine to 10 mg   Was on Lasix @ home & up ~3L of volume -- will need to be on Lasix, may need increased dose today - will check CXR  Will cut home Lantus dose in 1/2 as BS levels have been a bit low - anticipate that they increase after she goes home.  Had a short run of VT - will definitely need to restart BB dose.  Will monitor tonight to ensure that her HR tolerates BB & no further NSVT.  I am not sure of her EF -- cannot find her Cath note with LV Gram.  It would be prudent to get an echo prior to d/c tomorrow.  Marykay Lex, M.D., M.S. [facility] [address] [place], [place]  [address]  [phone]  04/12/2012 12:35 PM

## 2012-04-12 NOTE — Progress Notes (Signed)
CARDIAC REHAB PHASE I   PRE:  Rate/Rhythm: 55 SB with occ PVC    BP: sitting 128/56    SaO2: 95 RA  MODE:  Ambulation: 500 ft   POST:  Rate/Rhythm: 87 with 5 bt run VT    BP: sitting 150/60     SaO2: 95 RA  Order written per MI protocol. Pt used BSC, washed hands then ambulated with RW with supervision. Tolerated well without c/o CP, SOB or palpitations. Pt apparently had 5 bts VT on return to room, notified by monitor tech. No sx noted. Pt sts she enjoyed walk and like RW so requests rollator for home, which is appropriate. Reminded of NTG and walking daily.  [id number] Harriet Masson CES, ACSM

## 2012-04-13 ENCOUNTER — Inpatient Hospital Stay (HOSPITAL_COMMUNITY): Payer: Medicare Other

## 2012-04-13 LAB — COMPREHENSIVE METABOLIC PANEL
ALT: 13 U/L (ref 0–35)
Alkaline Phosphatase: 144 U/L — ABNORMAL HIGH (ref 39–117)
CO2: 21 mEq/L (ref 19–32)
Chloride: 113 mEq/L — ABNORMAL HIGH (ref 96–112)
GFR calc Af Amer: 41 mL/min — ABNORMAL LOW (ref >90)
Glucose, Bld: 112 mg/dL — ABNORMAL HIGH (ref 70–99)
Potassium: 3.9 mEq/L (ref 3.5–5.1)
Sodium: 143 mEq/L (ref 135–145)
Total Protein: 6.2 g/dL (ref 6.0–8.3)

## 2012-04-13 LAB — CBC
HCT: 26.9 % — ABNORMAL LOW (ref 36.0–46.0)
Hemoglobin: 8.7 g/dL — ABNORMAL LOW (ref 12.0–15.0)
MCH: 28.6 pg (ref 26.0–34.0)
MCHC: 32.3 g/dL (ref 30.0–36.0)
MCV: 88.5 fL (ref 78.0–100.0)
Platelets: 124 10*3/uL — ABNORMAL LOW (ref 150–400)
RBC: 3.04 MIL/uL — ABNORMAL LOW (ref 3.87–5.11)
RDW: 13.2 % (ref 11.5–15.5)
WBC: 4.6 10*3/uL (ref 4.0–10.5)

## 2012-04-13 MED ORDER — FUROSEMIDE 20 MG PO TABS
20.0000 mg | ORAL_TABLET | Freq: Every day | ORAL | Status: DC
  Administered 2012-04-13 – 2012-04-14 (×2): 20 mg via ORAL
  Filled 2012-04-13 (×2): qty 1

## 2012-04-13 MED ORDER — CARVEDILOL 3.125 MG PO TABS
3.1250 mg | ORAL_TABLET | Freq: Once | ORAL | Status: AC
  Administered 2012-04-13: 3.125 mg via ORAL
  Filled 2012-04-13: qty 1

## 2012-04-13 MED ORDER — CARVEDILOL 6.25 MG PO TABS
6.2500 mg | ORAL_TABLET | Freq: Two times a day (BID) | ORAL | Status: DC
  Administered 2012-04-13 – 2012-04-14 (×2): 6.25 mg via ORAL
  Filled 2012-04-13 (×5): qty 1

## 2012-04-13 NOTE — CV Procedure (Signed)
Cardiac Catherization  Catherine Clayton, 78 y.o., female  Full note dictated; see diagram in chart  DICTATION # [phone], [id number]  LM: 20% LAD: occluded at ostium LCX/RI: widely patent stent RCA: 30% prox, 70% mid post RV branch, occluded PDA  SVG to PDA: patent; SVG to LCX: old occlusion LIMA to LAD: patent  LV FXN: EF 50 - 55% with mild focal apical hypo-contractility  Med Therapy.Marland Kitchen  Lennette Bihari, MD, [facility] 04/13/2012 8:46 AM

## 2012-04-13 NOTE — Progress Notes (Signed)
Subjective: No complaints except nausea this AM, she has episodic nausea at home also  NSVT, Bradycardia Ambulated well  Objective: Vital signs in last 24 hours: Temp:  [98.5 F (36.9 C)-98.8 F (37.1 C)] 98.5 F (36.9 C) (07/11 7829) Pulse Rate:  [56-82] 67  (07/11 0803) Resp:  [16-18] 18  (07/10 2115) BP: (132-136)/(65-68) 136/68 mmHg (07/11 0632) SpO2:  [92 %-97 %] 97 % (07/11 5621) Weight change:  Last BM Date: 04/10/12 Intake/Output from previous day: +110 07/10 0701 - 07/11 0700 In: 480 [P.O.:480] Out: 201 [Urine:200; Stool:1] Intake/Output this shift:    PE: General:Alert, oriented, no complaints, nausea resolved Heart:S1S2 RRR Lungs:clear without rales, rhonchi or wheezes Abd:+ BS soft non tender Ext:no edema    Lab Results:  [facility] 04/13/12 0605 04/12/12 0520  WBC 4.6 4.9  HGB 8.7* 9.0*  HCT 26.9* 27.9*  PLT 124* 120*   BMET  Basename 04/12/12 0520 04/11/12 0648  NA 143 142  K 3.7 3.8  CL 113* 114*  CO2 21 19  GLUCOSE 108* 103*  BUN 22 20  CREATININE 1.35* 1.34*  CALCIUM 8.7 8.3*   No results found for this basename: TROPONINI:2,CK,MB:2 in the last 72 hours  Lab Results  Component Value Date   CHOL 124 04/10/2012   HDL 47 04/10/2012   LDLCALC 64 04/10/2012   TRIG 65 04/10/2012   CHOLHDL 2.6 04/10/2012   Lab Results  Component Value Date   HGBA1C 7.1* 04/09/2012     Lab Results  Component Value Date   TSH 0.441 04/11/2012    Hepatic Function Panel No results found for this basename: PROT,ALBUMIN,AST,ALT,ALKPHOS,BILITOT,BILIDIR,IBILI in the last 72 hours No results found for this basename: CHOL in the last 72 hours No results found for this basename: PROTIME in the last 72 hours    EKG: Orders placed during the hospital encounter of 04/09/12  . EKG 12-LEAD  . EKG 12-LEAD  . EKG 12-LEAD  . EKG 12-LEAD  . EKG 12-LEAD  . EKG 12-LEAD  . EKG 12-LEAD  . EKG 12-LEAD    Studies/Results: No results found.  Medications: I have reviewed the  patient's current medications.    Marland Kitchen amLODipine  10 mg Oral Daily  . aspirin EC  81 mg Oral Daily  . atorvastatin  40 mg Oral Daily  . calcium-vitamin D  1 tablet Oral Daily  . carvedilol  3.125 mg Oral BID WC  . clopidogrel  75 mg Oral Daily  . enoxaparin (LOVENOX) injection  30 mg Subcutaneous Q24H  . furosemide  20 mg Oral Daily  . gabapentin  100 mg Oral QHS  . glipiZIDE  2.5 mg Oral QAC breakfast  . insulin aspart  0-9 Units Subcutaneous TID WC  . isosorbide mononitrate  30 mg Oral Daily  . pantoprazole  40 mg Oral QHS  . DISCONTD: amLODipine  5 mg Oral Daily  . DISCONTD: carvedilol  3.125 mg Oral BID WC   Assessment/Plan: Patient Active Problem List  Diagnosis  . Hypertension  . CABG 2000. PCI 2/10 to RI, cath 04/10/12- medical Rx. EF 50-55%  . PAF. NSR on admission  . PVD, RFA PTA 2/12  . Hyperlipemia  . 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 NSVT, with normal K+, not on BB  . LBBB (left bundle branch block)  . NSTEMI, Troponin 1.59  . Bradycardia, resoved off coreg  . DM (diabetes mellitus)  . CKD (chronic kidney disease), stage III  . CAD (coronary artery disease),  stable with cath 04/10/12   PLAN:  On admit pt. Was bradycardic with heart rate in 40's and BB was d/c'd.  We restarted yesterday at lower dose, pt had 5 beat run of NSVT.   Last night with sleep only HR in 40's.  Con't couplets of PVCs.  Resuming Lasix today (it was on hold for cardiac cath with CKD stage 3  Per Dr. Herbie Baltimore recommendations will check 2 D Echo.      LOS: 4 days   INGOLD,LAURA R 04/13/2012, 8:13 AM   Patient seen and examined. Agree with assessment and plan. Medical therapy for CAD. EF 50 -55% with apical hypo-contractility. Continues to have ventricular ectopy.  Will increase carvedilol to 6.25 mg if HR does not get too bradycardic. For CXR today.   Lennette Bihari, MD, [facility] 04/13/2012 8:51 AM

## 2012-04-13 NOTE — Cardiovascular Report (Signed)
Catherine Clayton, Catherine Clayton NO.:  [id number]  MEDICAL RECORD NO.:  [id number]  LOCATION:  2008                         FACILITY:  [facility]  PHYSICIAN:  Nicki Guadalajara, M.D.     DATE OF BIRTH:  [birth year]  DATE OF PROCEDURE:  04/10/2012 DATE OF DISCHARGE:                           CARDIAC CATHETERIZATION   INDICATIONS:  [name] is an 78 year old female, who is followed by Dr. Allyson Sabal.  She is status post CABG revascularization surgery in 2000, at which time, she had a LIMA to her LAD, a vein to a circumflex marginal, and a vein to her PDA for right coronary artery.  In 2010, she was found to have an occluded graft to her circumflex system at which time, she underwent PCI to a non bypassed ramus intermediate vessel. She also has peripheral vascular disease and is status post stenting of bilateral lower extremities, and has documented occluded left SFA. There is a remote history of cerebellar bleed in April 2003.  She presented to [facility] on April 09, 2012, in transfer from [facility] after awakening from sleep with substernal chest pressure.  Her troponin was mildly positive.  ECG was nondiagnostic due to left bundle- branch block.  The patient now presents for definitive cardiac catheterization.  Peak troponin was 1.59.  PROCEDURE:  After premedication with Versed 1 mg, Fentanyl 25 mcg, the patient was prepped and draped in usual fashion.  Right femoral artery was punctured anteriorly and a 5-French sheath was inserted without difficulty.  Diagnostic catheterization was done utilizing 5-French Judkins 4 left and right catheters.  Selective angiography was also performed in the saphenous vein grafts as well as left internal mammary artery.  A 5-French pigtail catheter was used for RAO ventriculography. The patient tolerated the procedure well and returned to room in satisfactory condition.  HEMODYNAMIC DATA:  Central aortic pressure 159/58, left  ventricular pressure 159/14.  ANGIOGRAPHIC DATA:  Left main coronary artery had 20% narrowing prior to bifurcating into an LAD and left circumflex system.  The LAD was totally occluded at its origin proximal to remote stent.  The circumflex system seemed to arise as a ramus intermediate vessel and the AV groove circumflex branched off this.  There was a widely patent stent in this ramus intermediate like vessel from the procedure done in 2010.  The AV groove circumflex was angiographically normal.  The right coronary artery was a dominant vessel.  There was 30% proximal narrowing.  After the anterior RV marginal branch, there was 70% mid narrowing.  The PDA was totally occluded at its ostium arising from the RCA.  The vein graft supplying the PDA vessel was widely patent.  The vein graft which had supplied the circumflex system was totally occluded and this was old.  The LIMA graft which supplied the LAD was widely patent.  RAO ventriculography revealed an ejection fraction of 50-55%.  However, there was a focal apical hypocontractility.  IMPRESSION: 1. Low normal global left ventricular function with apical     hypocontractility. 2. Multivessel native coronary artery disease with 20% narrowing in     the distal left main, total occlusion of the left anterior  descending coronary artery at its ostium, widely patent stent in     the ramus intermediate vessel, and right coronary artery disease     with 30% proximal, 70% mid, and total occlusion of the posterior     descending artery in a calcified right coronary artery system. 3. Patent saphenous vein graft supplying the posterior descending     artery. 4. Old total occlusion of the graft, which had supplied the circumflex     system. 5. Patent left internal mammary artery graft to the left anterior     descending coronary artery.  RECOMMENDATION:  Medical therapy.          ______________________________ Nicki Guadalajara,  M.D.     TK/MEDQ  D:  04/13/2012  T:  04/13/2012  Job:  [id number]  cc:   Nanetta Batty, M.D.

## 2012-04-13 NOTE — Progress Notes (Signed)
CARDIAC REHAB PHASE I   PRE:  Rate/Rhythm: 56SB PVCs  BP:  Supine:   Sitting: 138/80  Standing:    SaO2: 94%RA  MODE:  Ambulation: 550 ft   POST:  Rate/Rhythem: 59-68 SB PVCs  BP:  Supine: 142/70  Sitting:   Standing:    SaO2: 96%RA 1100-1124 Pt walked 550 ft on RA with rollator and minimal asst. Stressed with pt that she has to make sure rollator is locked before sitting. Demonstrated with pt. Tolerated walk well. To bed at her request. Denied CP.  Catherine Clayton

## 2012-04-13 NOTE — Progress Notes (Signed)
UR Completed.  Vangie Bicker [phone] 04/13/2012

## 2012-04-14 LAB — CBC
HCT: 28.3 % — ABNORMAL LOW (ref 36.0–46.0)
Hemoglobin: 8.9 g/dL — ABNORMAL LOW (ref 12.0–15.0)
MCH: 27.8 pg (ref 26.0–34.0)
MCHC: 31.4 g/dL (ref 30.0–36.0)
MCV: 88.4 fL (ref 78.0–100.0)
Platelets: 120 10*3/uL — ABNORMAL LOW (ref 150–400)
RBC: 3.2 MIL/uL — ABNORMAL LOW (ref 3.87–5.11)
RDW: 13.3 % (ref 11.5–15.5)
WBC: 4.3 10*3/uL (ref 4.0–10.5)

## 2012-04-14 LAB — FOLATE: Folate: 16.5 ng/mL

## 2012-04-14 LAB — BASIC METABOLIC PANEL
BUN: 22 mg/dL (ref 6–23)
CO2: 22 mEq/L (ref 19–32)
Chloride: 108 mEq/L (ref 96–112)
Glucose, Bld: 171 mg/dL — ABNORMAL HIGH (ref 70–99)
Potassium: 4.6 mEq/L (ref 3.5–5.1)

## 2012-04-14 LAB — VITAMIN B12: Vitamin B-12: 218 pg/mL (ref 211–911)

## 2012-04-14 LAB — IRON AND TIBC
Saturation Ratios: 8 % — ABNORMAL LOW (ref 20–55)
TIBC: 323 ug/dL (ref 250–470)

## 2012-04-14 NOTE — Progress Notes (Signed)
Pt. Seen and examined. Agree with the NP/PA-C note as written. Feels much better today. Ambulating without difficulty. HR down to 30 overnight. Would decrease coreg to 3.125 BID. Plan for HR monitor on Monday. If she continues to have NSVT, she may need a pacemaker to allow increased b-blocker dosage. Ok for d/c home today.  Chrystie Nose, MD, [facility] Attending Cardiologist [facility]

## 2012-04-14 NOTE — Progress Notes (Signed)
Pt HR dropped down to 39, did not sustain, came back up into the 50's, VS stable, pt asymptomatic. Will continue to monitor.

## 2012-04-14 NOTE — Progress Notes (Signed)
Subjective: HR down in the 30's with increased dose of coreg.   Feeling stronger.  Objective: Vital signs in last 24 hours: Temp:  [97.7 F (36.5 C)-99.2 F (37.3 C)] 99.2 F (37.3 C) (07/12 0454) Pulse Rate:  [49-67] 51  (07/12 0454) Resp:  [18] 18  (07/12 0454) BP: (107-126)/(55-64) 117/55 mmHg (07/12 0454) SpO2:  [93 %-94 %] 93 % (07/12 0454) Weight:  [66.2 kg (145 lb 15.1 oz)] 66.2 kg (145 lb 15.1 oz) (07/12 0454) Weight change:  Last BM Date: 04/13/12 Intake/Output from previous day: +1080 07/11 0701 - 07/12 0700 In: 1080 [P.O.:1080] Out: -  Intake/Output this shift:    PE: General:alert and oriented Heart:S1S2 RRR with premature beats, soft murmur Lungs:clear without rales Abd:+ BS , soft non tender Ext:no edema    Lab Results:  Basename 04/14/12 0540 04/13/12 0605  WBC 4.3 4.6  HGB 8.9* 8.7*  HCT 28.3* 26.9*  PLT 120* 124*   BMET  Basename 04/13/12 0916 04/12/12 0520  NA 143 143  K 3.9 3.7  CL 113* 113*  CO2 21 21  GLUCOSE 112* 108*  BUN 21 22  CREATININE 1.34* 1.35*  CALCIUM 9.2 8.7   No results found for this basename: TROPONINI:2,CK,MB:2 in the last 72 hours  Lab Results  Component Value Date   CHOL 124 04/10/2012   HDL 47 04/10/2012   LDLCALC 64 04/10/2012   TRIG 65 04/10/2012   CHOLHDL 2.6 04/10/2012   Lab Results  Component Value Date   HGBA1C 7.1* 04/09/2012     Lab Results  Component Value Date   TSH 0.441 04/11/2012    Hepatic Function Panel  Basename 04/13/12 0916  PROT 6.2  ALBUMIN 2.8*  AST 22  ALT 13  ALKPHOS 144*  BILITOT 0.3  BILIDIR --  IBILI --   No results found for this basename: CHOL in the last 72 hours No results found for this basename: PROTIME in the last 72 hours    EKG: Orders placed during the hospital encounter of 04/09/12  . EKG 12-LEAD  . EKG 12-LEAD  . EKG 12-LEAD  . EKG 12-LEAD  . EKG 12-LEAD  . EKG 12-LEAD  . EKG 12-LEAD  . EKG 12-LEAD    Studies/Results: Dg Chest 2 View  04/13/2012   *RADIOLOGY REPORT*  Clinical Data: Congestive heart failure.  CHEST - 2 VIEW  Comparison: 07/07 and 12/29/2011 and 01/15/2008  Findings: There is chronic cardiomegaly.  The patient has hyperinflation of the lungs with flattening of the diaphragm consistent with COPD.  There are faint Kerley B lines at the bases consistent with slight interstitial edema.  No effusions.  Pulmonary vascularity is at upper limits of normal.  Old thoracic compression fracture treated with vertebroplasty.  IMPRESSION:  1.  Subtle Kerley B lines consistent with interstitial edema. 2.  Chronic cardiomegaly. 3.  Emphysema.  Original Report Authenticated By: Gwynn Burly, M.D.    Medications: I have reviewed the patient's current medications.    Marland Kitchen amLODipine  10 mg Oral Daily  . aspirin EC  81 mg Oral Daily  . atorvastatin  40 mg Oral Daily  . calcium-vitamin D  1 tablet Oral Daily  . carvedilol  3.125 mg Oral Once  . carvedilol  6.25 mg Oral BID WC  . clopidogrel  75 mg Oral Daily  . enoxaparin (LOVENOX) injection  30 mg Subcutaneous Q24H  . furosemide  20 mg Oral Daily  . gabapentin  100 mg Oral QHS  . glipiZIDE  2.5 mg Oral QAC breakfast  . insulin aspart  0-9 Units Subcutaneous TID WC  . isosorbide mononitrate  30 mg Oral Daily  . pantoprazole  40 mg Oral QHS  . DISCONTD: carvedilol  3.125 mg Oral BID WC   Assessment/Plan: Patient Active Problem List  Diagnosis  . Hypertension  . CABG 2000. PCI 2/10 to RI, cath 04/10/12- medical Rx. EF 50-55%  . PAF. NSR on admission  . PVD, RFA PTA 2/12  . Hyperlipemia  . 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 NSVT, with normal K+, not on BB  . LBBB (left bundle branch block)  . NSTEMI, Troponin 1.59  . Bradycardia, resoved off coreg  . DM (diabetes mellitus)  . CKD (chronic kidney disease), stage III  . CAD (coronary artery disease), stable with cath 04/10/12   PLAN: Bradycardia with HR down to 30's in direct response to increase of Coreg.  Less  PVCs  SSS with NSVT and bradycardia with BB.  Due to arrhythmia she needs BB.  Prob. need for pacemaker.  Check ua for low grade fever.  Mild anemia will check anemia panel.  2Decho ordered not yet done.  Has already rec'd coreg for this am. Glucose stable but appetite has been poor til this am.   LOS: 5 days   Catherine Clayton 04/14/2012, 7:41 AM

## 2012-04-17 NOTE — Discharge Summary (Signed)
Physician Discharge Summary  Patient ID: Catherine Clayton MRN: [id number] DOB/AGE: [birth year] 78 y.o.  Admit date: 04/09/2012 Discharge date: 04/14/2012  Discharge Diagnoses:  Principal Problem:  *NSTEMI, Troponin 1.59 Active Problems:  NSVT, with normal K+, not on BB, improved but continued episodes on BB  Bradycardia, resoved off coreg, but on small dose HR down in 40's.  To evaluate as outpatient  CABG 2000. PCI 2/10 to RI, cath 04/10/12- medical Rx. EF 50-55%  PAF. 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 Nicki Guadalajara /8/13 LM: 20%  LAD: occluded at ostium  LCX/RI: widely patent stent  RCA: 30% prox, 70% mid post RV branch, occluded PDA  SVG to PDA: patent;  SVG to LCX: old occlusion  LIMA to LAD: patent  LV FXN: EF 50 - 55% with mild focal apical hypo-contractility   Hospital Course: 78 year old white widowed female presented to [facility] 04/09/12 after waking at 2 AM with chest pain, initially she stated she woke up to go the bathroom but then developed midsternal tightness pressure with tingling in both arms she had dizziness but no nausea vomiting diaphoresis or shortness of breath. Her daughter was at her house, they gave her nitroglycerin sublingual and 325 mg of aspirin with improvement and finally resolution of her chest pain. By this time she went to the emergency room and was found to be bradycardic with her chronic left bundle branch block. She was then transported to [facility] for further evaluation.   On admit she was pain-free, EKG sinus bradycardia in the 40s with frequent PVCs and short bursts of nonsustained ventricular tachycardia. She has chronic left bundle  branch block.  Cardiac history includes coronary bypass grafting in 2000 and a non-ST elevation MI in February of 2010 with an occluded LAD and graft, patent LIMA to the LAD, patent vein graft to the diagonal branch and PDA. She did have high-grade ramus branch disease which had been not bypassed and underwent PCI to this vessel by Dr. Jacinto Halim.   Other history does include peripheral vascular disease with angioplasty and stent to her left SFA with occlusion by subsequent Dopplers. She also had an occluded right femoral artery and underwent recanalization and scant in April 2012 increasing her ABI from 0.49 tp 0.9. In October 2000 1210 nonsustained ventricular tachycardia secondary to hypokalemia and hypomagnesemia. EF at that time was 40-45% a followup Myoview in the same timeframe was nonischemic.   In April of 2013 she had a small cerebellar 6 mm hemorrhage per CT at [facility] but was not seen on repeated MRI or CT at [facility] after admission. At that time she continued on Plavix but her aspirin was DC'd.   Cardiac cath was completed without any acute changes and medical therapy was recommended.    Pt was bradycardic on admit in the SB in the 40's and her betablocker was stopped.  Post cath she developed NSVT with normal K+.  We resumed the coreg at a fourth her outpatient dose.  By the next AM HR was in 50's, but still with episodes of NSVT 3-4 beats.  Coreg was again increased to 6.25, but at that dose her pulse would dip into 30's during the  day and night.   At discharge she was on 3.125 mg. BID and she will hear event monitor as outpatient.  We did discuss possibility of pacemaker.  Also during the hospitalization her glucose was low.  We discharged at her at half her Lantus dose.  She should follow up with her primary MD for diabetes management.  2D Echo was ordered but not done prior to discharge.    She was seen and evaluated by Dr. Rennis Golden after ambulating, felt stable and ready for discharge home.   2D Echo ordered the day before had not yet been done will be done as outpatient.   Consults: None  Significant Diagnostic Studies: CXR:  1. Subtle Kerley B lines consistent with interstitial edema.  2. Chronic cardiomegaly.  3. Emphysema.   Pro BNP 214, though Cr. Also elevated.   Na 140, K+ 4.6, BUN 22, Cr. 1.56, 171  Hgb 8.9, HCT 28.3, WBC 4.3, plts 120  Anemia panel returned after discharge:  Iron 26, UIBC 297 TIBC 323, Ferritin 22    Discharge Exam: Blood pressure 117/55, pulse 57, temperature 99.2 F (37.3 C), temperature source Oral, resp. rate 18, height 5\' 6"  (1.676 m), weight 66.2 kg (145 lb 15.1 oz), SpO2 93.00%.   General:alert and oriented  Heart:S1S2 RRR with premature beats, soft murmur  Lungs:clear without rales  Abd:+ BS , soft non tender  Ext:no edema  Disposition: 01-Home or Self Care   Medication List  As of 04/17/2012  1:15 PM   STOP taking these medications         RABEprazole 20 MG tablet         TAKE these medications         amLODipine 10 MG tablet   Commonly known as: NORVASC   Take 1 tablet (10 mg total) by mouth daily.      aspirin 81 MG EC tablet   Take 1 tablet (81 mg total) by mouth daily.      atorvastatin 40 MG tablet   Commonly known as: LIPITOR   Take 40 mg by mouth daily.      calcium-vitamin D 500-200 MG-UNIT per tablet   Commonly known as: OSCAL WITH D   Take 1 tablet by mouth daily.      carvedilol 3.125 MG tablet   Commonly known as: COREG   Take 1 tablet (3.125 mg total) by mouth 2 (two) times daily with a meal.      clopidogrel 75 MG tablet   Commonly known as: PLAVIX   Take 75 mg by mouth daily.      furosemide 20 MG tablet   Commonly known as: LASIX   Take 20 mg by mouth daily.      gabapentin 100 MG capsule   Commonly known as: NEURONTIN   Take 1 capsule (100 mg total) by mouth at bedtime.      insulin glargine 100 UNIT/ML injection   Commonly known as: LANTUS   Inject 20 Units into the skin daily.       insulin lispro 100 UNIT/ML injection   Commonly known as: HUMALOG   Inject 15 Units into the skin daily as needed. In the morning if CBG>150      isosorbide mononitrate 30 MG 24 hr tablet   Commonly known as: IMDUR   Take 1 tablet (30 mg total) by mouth daily.      levothyroxine 75 MCG tablet   Commonly known as: SYNTHROID, LEVOTHROID   Take 75 mcg by  mouth daily.      nitroGLYCERIN 0.4 MG SL tablet   Commonly known as: NITROSTAT   Place 1 tablet (0.4 mg total) under the tongue every 5 (five) minutes x 3 doses as needed for chest pain.      pantoprazole 40 MG tablet   Commonly known as: PROTONIX   Take 40 mg by mouth at bedtime.      potassium chloride SA 20 MEQ tablet   Commonly known as: K-DUR,KLOR-CON   Take 20 mEq by mouth daily.      promethazine 12.5 MG suppository   Commonly known as: PHENERGAN   Place 12.5 mg rectally every 6 (six) hours as needed. For nausea      VITAMIN D PO   Take 125 mg by mouth every 7 (seven) days.           Follow-up Information    Follow up with Abelino Derrick, PA on 04/27/2012. (at 2:00 pm  Dr. Hazle Coca PA.)    Contact information:   [address] [place] [address] [phone]       Follow up with Runell Gess, MD on 04/17/2012. (at 10:30 am for monitor placement.)    Contact information:   [address] [place] [address] [phone]        DISCHARGE INSTRUCTIONS: Heart healthy diabetic diet  Monitor your glucose carefully, in the hospital your glucose was lower, you may have to hold or decrease your home insulin, I have already decreased the dose.  If you feel lightheaded or weak call the office.     SignedLeone Brand 04/17/2012, 1:15 PM

## 2012-05-10 DIAGNOSIS — I10 Essential (primary) hypertension: Secondary | ICD-10-CM

## 2012-05-10 HISTORY — DX: Essential (primary) hypertension: I10

## 2012-08-28 ENCOUNTER — Other Ambulatory Visit (HOSPITAL_COMMUNITY): Payer: Self-pay | Admitting: Cardiovascular Disease

## 2012-08-28 DIAGNOSIS — I82409 Acute embolism and thrombosis of unspecified deep veins of unspecified lower extremity: Secondary | ICD-10-CM

## 2012-08-28 DIAGNOSIS — R609 Edema, unspecified: Secondary | ICD-10-CM

## 2012-08-29 ENCOUNTER — Ambulatory Visit (HOSPITAL_COMMUNITY)
Admission: RE | Admit: 2012-08-29 | Discharge: 2012-08-29 | Disposition: A | Discharge status: Home / Self Care | Payer: Medicare Other | Source: Ambulatory Visit | Attending: Cardiovascular Disease | Admitting: Cardiovascular Disease

## 2012-08-29 DIAGNOSIS — R609 Edema, unspecified: Secondary | ICD-10-CM | POA: Insufficient documentation

## 2012-08-29 DIAGNOSIS — I82409 Acute embolism and thrombosis of unspecified deep veins of unspecified lower extremity: Secondary | ICD-10-CM

## 2012-08-29 NOTE — Progress Notes (Signed)
Left Lower extremity completed. No evidence of DVT. Catherine Clayton

## 2013-01-14 ENCOUNTER — Encounter: Payer: Self-pay | Admitting: *Deleted

## 2013-01-16 ENCOUNTER — Encounter: Payer: Self-pay | Admitting: Cardiovascular Disease

## 2013-02-16 ENCOUNTER — Telehealth: Payer: Self-pay | Admitting: Cardiovascular Disease

## 2013-02-16 MED ORDER — HYDRALAZINE HCL 25 MG PO TABS: 25.0000 mg | ORAL_TABLET | Freq: Three times a day (TID) | ORAL | Status: DC

## 2013-02-16 NOTE — Telephone Encounter (Signed)
rx for 30 day supply sent to [facility], rx for 90 day supply sent to [facility], Catherine Clayton advised pt's son this was done Pt last seen 02/05/13

## 2013-02-16 NOTE — Telephone Encounter (Signed)
Son said  They went to pharmacy-refill still have not been received for her Hydralazine!Please call this in today!

## 2013-02-16 NOTE — Telephone Encounter (Signed)
Calling for a prescription refill on her Hydralazine-25mg .. Needs a 30 day supply [facility].[address] in [place].. Was suppose to sent to [facility] for her refill , they said they sent a refill request on today .Marland Kitchen Is almost out of her medication.

## 2013-03-08 ENCOUNTER — Inpatient Hospital Stay (HOSPITAL_COMMUNITY)
Admission: AD | Admit: 2013-03-08 | Discharge: 2013-03-19 | DRG: 293 | Payer: Medicare Other | Source: Other Acute Inpatient Hospital | Attending: Cardiovascular Disease | Admitting: Cardiovascular Disease

## 2013-03-08 ENCOUNTER — Encounter (HOSPITAL_COMMUNITY): Payer: Self-pay | Admitting: *Deleted

## 2013-03-08 DIAGNOSIS — K219 Gastro-esophageal reflux disease without esophagitis: Secondary | ICD-10-CM | POA: Diagnosis present

## 2013-03-08 DIAGNOSIS — I4891 Unspecified atrial fibrillation: Secondary | ICD-10-CM | POA: Diagnosis present

## 2013-03-08 DIAGNOSIS — I255 Ischemic cardiomyopathy: Secondary | ICD-10-CM

## 2013-03-08 DIAGNOSIS — I10 Essential (primary) hypertension: Secondary | ICD-10-CM

## 2013-03-08 DIAGNOSIS — E119 Type 2 diabetes mellitus without complications: Secondary | ICD-10-CM | POA: Diagnosis present

## 2013-03-08 DIAGNOSIS — I129 Hypertensive chronic kidney disease with stage 1 through stage 4 chronic kidney disease, or unspecified chronic kidney disease: Secondary | ICD-10-CM | POA: Diagnosis present

## 2013-03-08 DIAGNOSIS — R600 Localized edema: Secondary | ICD-10-CM

## 2013-03-08 DIAGNOSIS — Z85038 Personal history of other malignant neoplasm of large intestine: Secondary | ICD-10-CM

## 2013-03-08 DIAGNOSIS — E039 Hypothyroidism, unspecified: Secondary | ICD-10-CM | POA: Diagnosis present

## 2013-03-08 DIAGNOSIS — I739 Peripheral vascular disease, unspecified: Secondary | ICD-10-CM | POA: Diagnosis present

## 2013-03-08 DIAGNOSIS — R001 Bradycardia, unspecified: Secondary | ICD-10-CM | POA: Diagnosis present

## 2013-03-08 DIAGNOSIS — I5023 Acute on chronic systolic (congestive) heart failure: Secondary | ICD-10-CM

## 2013-03-08 DIAGNOSIS — I2510 Atherosclerotic heart disease of native coronary artery without angina pectoris: Secondary | ICD-10-CM | POA: Diagnosis present

## 2013-03-08 DIAGNOSIS — I493 Ventricular premature depolarization: Secondary | ICD-10-CM | POA: Diagnosis present

## 2013-03-08 DIAGNOSIS — E785 Hyperlipidemia, unspecified: Secondary | ICD-10-CM | POA: Diagnosis present

## 2013-03-08 DIAGNOSIS — I480 Paroxysmal atrial fibrillation: Secondary | ICD-10-CM

## 2013-03-08 DIAGNOSIS — Z794 Long term (current) use of insulin: Secondary | ICD-10-CM

## 2013-03-08 DIAGNOSIS — I5021 Acute systolic (congestive) heart failure: Secondary | ICD-10-CM | POA: Diagnosis present

## 2013-03-08 DIAGNOSIS — N183 Chronic kidney disease, stage 3 unspecified: Secondary | ICD-10-CM | POA: Diagnosis present

## 2013-03-08 DIAGNOSIS — N182 Chronic kidney disease, stage 2 (mild): Secondary | ICD-10-CM | POA: Diagnosis present

## 2013-03-08 DIAGNOSIS — I509 Heart failure, unspecified: Secondary | ICD-10-CM | POA: Diagnosis present

## 2013-03-08 DIAGNOSIS — I447 Left bundle-branch block, unspecified: Secondary | ICD-10-CM | POA: Diagnosis present

## 2013-03-08 DIAGNOSIS — I379 Nonrheumatic pulmonary valve disorder, unspecified: Secondary | ICD-10-CM

## 2013-03-08 DIAGNOSIS — I70209 Unspecified atherosclerosis of native arteries of extremities, unspecified extremity: Secondary | ICD-10-CM | POA: Diagnosis present

## 2013-03-08 DIAGNOSIS — I639 Cerebral infarction, unspecified: Secondary | ICD-10-CM | POA: Diagnosis present

## 2013-03-08 DIAGNOSIS — N289 Disorder of kidney and ureter, unspecified: Secondary | ICD-10-CM

## 2013-03-08 DIAGNOSIS — Z8673 Personal history of transient ischemic attack (TIA), and cerebral infarction without residual deficits: Secondary | ICD-10-CM

## 2013-03-08 DIAGNOSIS — D649 Anemia, unspecified: Secondary | ICD-10-CM | POA: Diagnosis present

## 2013-03-08 DIAGNOSIS — I252 Old myocardial infarction: Secondary | ICD-10-CM

## 2013-03-08 DIAGNOSIS — I2589 Other forms of chronic ischemic heart disease: Secondary | ICD-10-CM | POA: Diagnosis present

## 2013-03-08 DIAGNOSIS — Z951 Presence of aortocoronary bypass graft: Secondary | ICD-10-CM | POA: Diagnosis present

## 2013-03-08 DIAGNOSIS — I498 Other specified cardiac arrhythmias: Secondary | ICD-10-CM | POA: Diagnosis present

## 2013-03-08 HISTORY — DX: Cerebral infarction, unspecified: I63.9

## 2013-03-08 HISTORY — DX: Ischemic cardiomyopathy: I25.5

## 2013-03-08 HISTORY — DX: Chronic kidney disease, stage 3 (moderate): N18.3

## 2013-03-08 HISTORY — DX: Paroxysmal atrial fibrillation: I48.0

## 2013-03-08 LAB — BASIC METABOLIC PANEL
Calcium: 8.9 mg/dL (ref 8.4–10.5)
GFR calc Af Amer: 23 mL/min — ABNORMAL LOW (ref >90)
GFR calc non Af Amer: 20 mL/min — ABNORMAL LOW (ref >90)
Glucose, Bld: 93 mg/dL (ref 70–99)
Sodium: 138 mEq/L (ref 135–145)

## 2013-03-08 LAB — CBC
Hemoglobin: 8.2 g/dL — ABNORMAL LOW (ref 12.0–15.0)
MCH: 25.5 pg — ABNORMAL LOW (ref 26.0–34.0)
RBC: 3.21 MIL/uL — ABNORMAL LOW (ref 3.87–5.11)

## 2013-03-08 LAB — MRSA PCR SCREENING: MRSA by PCR: NEGATIVE

## 2013-03-08 LAB — GLUCOSE, CAPILLARY
Glucose-Capillary: 120 mg/dL — ABNORMAL HIGH (ref 70–99)
Glucose-Capillary: 129 mg/dL — ABNORMAL HIGH (ref 70–99)

## 2013-03-08 LAB — HEMOGLOBIN A1C: Hgb A1c MFr Bld: 7 % — ABNORMAL HIGH (ref <5.7)

## 2013-03-08 LAB — TSH: TSH: 2.712 u[IU]/mL (ref 0.350–4.500)

## 2013-03-08 LAB — PRO B NATRIURETIC PEPTIDE: Pro B Natriuretic peptide (BNP): 11058 pg/mL — ABNORMAL HIGH (ref 0–450)

## 2013-03-08 MED ORDER — LEVOTHYROXINE SODIUM 75 MCG PO TABS
75.0000 ug | ORAL_TABLET | Freq: Every day | ORAL | Status: DC
  Administered 2013-03-09 – 2013-03-19 (×11): 75 ug via ORAL
  Filled 2013-03-08 (×12): qty 1

## 2013-03-08 MED ORDER — SODIUM CHLORIDE 0.9 % IJ SOLN
3.0000 mL | Freq: Two times a day (BID) | INTRAMUSCULAR | Status: DC
  Administered 2013-03-08 – 2013-03-19 (×18): 3 mL via INTRAVENOUS

## 2013-03-08 MED ORDER — FUROSEMIDE 10 MG/ML IJ SOLN
40.0000 mg | Freq: Once | INTRAMUSCULAR | Status: AC
  Administered 2013-03-08: 40 mg via INTRAVENOUS
  Filled 2013-03-08: qty 4

## 2013-03-08 MED ORDER — ALPRAZOLAM 0.25 MG PO TABS
0.2500 mg | ORAL_TABLET | Freq: Two times a day (BID) | ORAL | Status: DC | PRN
  Administered 2013-03-09 – 2013-03-10 (×3): 0.25 mg via ORAL
  Filled 2013-03-08 (×3): qty 1

## 2013-03-08 MED ORDER — ASPIRIN EC 81 MG PO TBEC
81.0000 mg | DELAYED_RELEASE_TABLET | Freq: Every day | ORAL | Status: DC
  Administered 2013-03-09 – 2013-03-19 (×11): 81 mg via ORAL
  Filled 2013-03-08 (×12): qty 1

## 2013-03-08 MED ORDER — SODIUM CHLORIDE 0.9 % IJ SOLN: 3.0000 mL | INTRAMUSCULAR | Status: DC | PRN

## 2013-03-08 MED ORDER — INSULIN GLARGINE 100 UNIT/ML ~~LOC~~ SOLN
22.0000 [IU] | Freq: Every day | SUBCUTANEOUS | Status: DC
  Administered 2013-03-08: 22 [IU] via SUBCUTANEOUS
  Filled 2013-03-08 (×2): qty 0.22

## 2013-03-08 MED ORDER — PANTOPRAZOLE SODIUM 40 MG PO TBEC
40.0000 mg | DELAYED_RELEASE_TABLET | Freq: Every day | ORAL | Status: DC
  Administered 2013-03-08 – 2013-03-18 (×11): 40 mg via ORAL
  Filled 2013-03-08 (×11): qty 1

## 2013-03-08 MED ORDER — ZOLPIDEM TARTRATE 5 MG PO TABS
5.0000 mg | ORAL_TABLET | Freq: Every evening | ORAL | Status: DC | PRN
  Administered 2013-03-10 – 2013-03-18 (×7): 5 mg via ORAL
  Filled 2013-03-08 (×7): qty 1

## 2013-03-08 MED ORDER — INSULIN GLARGINE 100 UNIT/ML ~~LOC~~ SOLN
20.0000 [IU] | Freq: Every day | SUBCUTANEOUS | Status: DC
  Filled 2013-03-08 (×2): qty 0.2

## 2013-03-08 MED ORDER — CALCIUM CARBONATE-VITAMIN D 500-200 MG-UNIT PO TABS
1.0000 | ORAL_TABLET | Freq: Every day | ORAL | Status: DC
  Administered 2013-03-09 – 2013-03-19 (×11): 1 via ORAL
  Filled 2013-03-08 (×12): qty 1

## 2013-03-08 MED ORDER — HEPARIN (PORCINE) IN NACL 100-0.45 UNIT/ML-% IJ SOLN
800.0000 [IU]/h | INTRAMUSCULAR | Status: DC
  Administered 2013-03-08: 900 [IU]/h via INTRAVENOUS
  Administered 2013-03-11: 800 [IU]/h via INTRAVENOUS
  Administered 2013-03-11: 900 [IU]/h via INTRAVENOUS
  Administered 2013-03-12: 800 [IU]/h via INTRAVENOUS
  Filled 2013-03-08 (×7): qty 250

## 2013-03-08 MED ORDER — POTASSIUM CHLORIDE CRYS ER 20 MEQ PO TBCR
20.0000 meq | EXTENDED_RELEASE_TABLET | Freq: Every day | ORAL | Status: DC
  Administered 2013-03-09 – 2013-03-19 (×11): 20 meq via ORAL
  Filled 2013-03-08 (×12): qty 1

## 2013-03-08 MED ORDER — HEPARIN BOLUS VIA INFUSION
3000.0000 [IU] | Freq: Once | INTRAVENOUS | Status: AC
  Administered 2013-03-08: 3000 [IU] via INTRAVENOUS
  Filled 2013-03-08: qty 3000

## 2013-03-08 MED ORDER — NITROGLYCERIN 0.4 MG SL SUBL: 0.4000 mg | SUBLINGUAL_TABLET | SUBLINGUAL | Status: DC | PRN

## 2013-03-08 MED ORDER — ISOSORBIDE MONONITRATE ER 30 MG PO TB24
30.0000 mg | ORAL_TABLET | Freq: Every day | ORAL | Status: DC
  Administered 2013-03-09 – 2013-03-19 (×11): 30 mg via ORAL
  Filled 2013-03-08 (×12): qty 1

## 2013-03-08 MED ORDER — CARVEDILOL 6.25 MG PO TABS
6.2500 mg | ORAL_TABLET | Freq: Two times a day (BID) | ORAL | Status: DC
  Administered 2013-03-09: 6.25 mg via ORAL
  Filled 2013-03-08 (×4): qty 1

## 2013-03-08 MED ORDER — SODIUM CHLORIDE 0.9 % IV SOLN
250.0000 mL | INTRAVENOUS | Status: DC | PRN
  Administered 2013-03-10: 19:00:00 via INTRAVENOUS
  Administered 2013-03-13: 500 mL via INTRAVENOUS

## 2013-03-08 MED ORDER — GABAPENTIN 100 MG PO CAPS
100.0000 mg | ORAL_CAPSULE | Freq: Every day | ORAL | Status: DC
  Administered 2013-03-08 – 2013-03-18 (×11): 100 mg via ORAL
  Filled 2013-03-08 (×13): qty 1

## 2013-03-08 MED ORDER — ATORVASTATIN CALCIUM 40 MG PO TABS
40.0000 mg | ORAL_TABLET | Freq: Every day | ORAL | Status: DC
  Administered 2013-03-09 – 2013-03-19 (×11): 40 mg via ORAL
  Filled 2013-03-08 (×12): qty 1

## 2013-03-08 MED ORDER — ONDANSETRON HCL 4 MG/2ML IJ SOLN
4.0000 mg | Freq: Four times a day (QID) | INTRAMUSCULAR | Status: DC | PRN
  Administered 2013-03-10: 4 mg via INTRAVENOUS
  Filled 2013-03-08 (×2): qty 2

## 2013-03-08 MED ORDER — INSULIN ASPART 100 UNIT/ML ~~LOC~~ SOLN
0.0000 [IU] | Freq: Three times a day (TID) | SUBCUTANEOUS | Status: DC
  Administered 2013-03-09: 2 [IU] via SUBCUTANEOUS
  Administered 2013-03-09 – 2013-03-10 (×2): 1 [IU] via SUBCUTANEOUS
  Administered 2013-03-10 – 2013-03-11 (×2): 2 [IU] via SUBCUTANEOUS
  Administered 2013-03-12: 1 [IU] via SUBCUTANEOUS
  Administered 2013-03-12: 2 [IU] via SUBCUTANEOUS
  Administered 2013-03-13 (×2): 1 [IU] via SUBCUTANEOUS
  Administered 2013-03-14: 2 [IU] via SUBCUTANEOUS
  Administered 2013-03-15: 1 [IU] via SUBCUTANEOUS
  Administered 2013-03-15: 2 [IU] via SUBCUTANEOUS
  Administered 2013-03-16: 1 [IU] via SUBCUTANEOUS
  Administered 2013-03-16: 2 [IU] via SUBCUTANEOUS
  Administered 2013-03-17: 1 [IU] via SUBCUTANEOUS
  Administered 2013-03-17: 2 [IU] via SUBCUTANEOUS
  Administered 2013-03-18: 3 [IU] via SUBCUTANEOUS
  Administered 2013-03-18: 1 [IU] via SUBCUTANEOUS
  Administered 2013-03-19: 3 [IU] via SUBCUTANEOUS

## 2013-03-08 MED ORDER — ACETAMINOPHEN 325 MG PO TABS: 650.0000 mg | ORAL_TABLET | ORAL | Status: DC | PRN

## 2013-03-08 NOTE — Progress Notes (Signed)
ANTICOAGULATION CONSULT NOTE - Initial Consult  Pharmacy Consult for heparin  Indication: atrial fibrillation  Allergies  Allergen Reactions  . Influenza Vaccines Swelling  . Demerol Nausea And Vomiting  . Promethazine Other (See Comments)    Pt. Unsure  . Zithromax (Azithromycin) Nausea And Vomiting  . Penicillins Rash    Patient Measurements: Height: 5\' 6"  (167.6 cm) Weight: 154 lb 1.6 oz (69.9 kg) IBW/kg (Calculated) : 59.3  Vital Signs: Temp: 99 F (37.2 C) (06/05 1400) Temp src: Oral (06/05 1400)  Labs: No results found for this basename: HGB, HCT, PLT, APTT, LABPROT, INR, HEPARINUNFRC, CREATININE, CKTOTAL, CKMB, TROPONINI,  in the last 72 hours  Estimated Creatinine Clearance: 24.2 ml/min (by C-G formula based on Cr of 1.56).   Medical History: Past Medical History  Diagnosis Date  . Coronary artery disease     Cabg x 4  . Myocardial infarction   . Atrial fibrillation   . Diabetes mellitus     insulin dependent  . Shortness of breath   . Hypothyroidism   . GERD (gastroesophageal reflux disease)   . Arthritis     Bilateral arms  . Anxiety   . Hyperlipemia   . Peripheral vascular disease     R Femerol artery stent  . Ovarian cancer   . Rectal cancer   . NSTEMI (non-ST elevated myocardial infarction) 04/09/2012  . Bradycardia 04/09/2012  . DM (diabetes mellitus) 04/09/2012  . CAD (coronary artery disease) of artery bypass graft, occluded OM graft, patent LIMA to LAD, VG tp diag and PDA 04/09/2012  . Chronic renal insufficiency, stage II (mild) 04/11/2012  . CAD (coronary artery disease), stable with cath 04/10/12 04/12/2012  . Angina 10/01/11    Nuclear stress test-short runs of NSVT during lexiscan administration.  . Hypertension 05/10/12    ECHO 51%      Assessment: 88yof with Hx HF EF 40%, CAD CABG 2000- with MI 2013 and stents, PVOD-stent SFA, HLD, DM, and Afib with CVA 2013 but not on warfarin given age.  She has continued ASA81mg  and Plavix.  She is  admitted from [facility] for SOB x1 day and Afib with RVR.  Currently HR 80s, and BP 90/60.  Heparin is to be started for [facility] while in hospital.  Labs in process Goal of Therapy:  Heparin level 0.3-0.7 units/ml Monitor platelets by anticoagulation protocol: Yes   Plan:  Heparin bolus 3000 uts IV x1 Heparin drip rate 900 uts/hr Draw HL, CBC in 6hr from start and daily  Leota Sauers Pharm.D. CPP, BCPS Clinical Pharmacist [phone] 03/08/2013 4:42 PM

## 2013-03-08 NOTE — Progress Notes (Signed)
  Echocardiogram 2D Echocardiogram has been performed.  Thanvi Blincoe FRANCES 03/08/2013, 6:56 PM

## 2013-03-08 NOTE — H&P (Signed)
Chief Complaint: Direct transfer from [facility] for CHF and Atrial Fibrillation  HPI: The patient is a 79 y/o Caucasian female, followed at [facility] by Dr. Allyson Sabal. She has a history of CAD, status post coronary artery bypass grafting in 2000. She has known PVOD, status post stenting of her left SFA which by Dopplers appear occluded, as well as hypertension, hyperlipidemia, and diabetes. She has had colon cancer in the past, treated with chemotherapy and radiation therapy. She was catheterized by Dr. Daphene Jaeger in the setting of a non-STEMI on April 10, 2012, revealing a patent vein graft to an RCA, patent LIMA to an occluded LAD, and a patent stent to a ramus branch with an EF of 50% to 55% and apical hypokinesia. Dr. Allyson Sabal intervened on her right common femoral artery January 26, 2011, at that time the patient had an occluded left SFA secondary to "in-stent restenosis." Outpatient [facility] has shown some PAF as well as PVCs which were apparently conducted. She did have a stroke earlier in 2013 and it was decided to keep her on aspirin and Plavix as opposed to Coumadin, given her age. The patient initially presented to [facility], in [place], earlier today with a complaint of increasing shortness of breath x 1 day and progressively worsening bilateral lower extremity edema x 4 days. Work-up at [facility] revealed the patient to be in CHF. BNP was 15,800 and her CXR was also suggestive of CHF. She was given 1 dose of IV Lasix. She was also noted to be in atrial fibrillation with a controlled response. She was transferred to [facility] for further evaluation and treatment. Upon arrival the patient was on supplemental O2 at 2L/min. O2 sats in the mid 90s. She was in atrial fibrillation with a HR in the 90s. Her systolic blood pressures were in the low 100s. At time of first encounter, the patient reported that her shortness of breath has improved. She denies palpations, lightheadedness or dizziness. She  denies any current or recent chest pain and states that she did not have chest pain with her prior MIs. She states that she has had nausea but no vomiting. She endorses belching and a nonproductive cough. She denies PND and orthopnea. No hematuria, hematochezia. She endorses some dark stools several weeks ago, but none in the past week. She denies fever and chills. The patient reports compliance with her daily medications. Her daughter-in-law states that she did eat a hotdog 4 days ago and eats saltine crackers with peanut butter as a daily snack.   Past Medical History  Diagnosis Date  . Coronary artery disease     Cabg x 4  . Myocardial infarction   . Atrial fibrillation   . Diabetes mellitus     insulin dependent  . Shortness of breath   . Hypothyroidism   . GERD (gastroesophageal reflux disease)   . Arthritis     Bilateral arms  . Anxiety   . Hyperlipemia   . Peripheral vascular disease     R Femerol artery stent  . Ovarian cancer   . Rectal cancer   . NSTEMI (non-ST elevated myocardial infarction) 04/09/2012  . Bradycardia 04/09/2012  . DM (diabetes mellitus) 04/09/2012  . CAD (coronary artery disease) of artery bypass graft, occluded OM graft, patent LIMA to LAD, VG tp diag and PDA 04/09/2012  . Chronic renal insufficiency, stage II (mild) 04/11/2012  . CAD (coronary artery disease), stable with cath 04/10/12 04/12/2012  . Angina 10/01/11    Nuclear stress  test-short runs of NSVT during lexiscan administration.  . Hypertension 05/10/12    ECHO 51%    Past Surgical History  Procedure Laterality Date  . Coronary artery bypass graft      x 4 1995  . Abdominal hysterectomy    . Tonsillectomy    . Appendectomy    . Back surgery      thoracic "cementing"  . Vascular surgery  01/26/2011    Common femoral stent  . Cardiac catheterization      Family History  Problem Relation Age of Onset  . Breast cancer Sister   . Coronary artery disease Sister   . Alzheimer's disease Sister   .  Stroke Brother   . Stroke Sister   . Hypertension Son    Social History:  reports that she has never smoked. She has never used smokeless tobacco. She reports that she does not drink alcohol or use illicit drugs.  Allergies:  Allergies  Allergen Reactions  . Influenza Vaccines Swelling  . Demerol Nausea And Vomiting  . Promethazine Other (See Comments)    Pt. Unsure  . Zithromax (Azithromycin) Nausea And Vomiting  . Penicillins Rash    Medications Prior to Admission  Medication Sig Dispense Refill  . amLODipine (NORVASC) 10 MG tablet Take 1 tablet (10 mg total) by mouth daily.  30 tablet  11  . aspirin EC 81 MG EC tablet Take 1 tablet (81 mg total) by mouth daily.      Marland Kitchen atorvastatin (LIPITOR) 40 MG tablet Take 40 mg by mouth daily.      . calcium-vitamin D (OSCAL WITH D) 500-200 MG-UNIT per tablet Take 1 tablet by mouth daily.      . carvedilol (COREG) 3.125 MG tablet Take 1 tablet (3.125 mg total) by mouth 2 (two) times daily with a meal.  60 tablet  11  . carvedilol (COREG) 6.25 MG tablet Take 6.25 mg by mouth 2 (two) times daily with a meal.      . Cholecalciferol (VITAMIN D PO) Take 125 mg by mouth every 7 (seven) days.      . clopidogrel (PLAVIX) 75 MG tablet Take 75 mg by mouth daily.      . furosemide (LASIX) 20 MG tablet Take 20 mg by mouth daily.      Marland Kitchen gabapentin (NEURONTIN) 100 MG capsule Take 1 capsule (100 mg total) by mouth at bedtime.  30 capsule  2  . hydrALAZINE (APRESOLINE) 25 MG tablet Take 1 tablet (25 mg total) by mouth 3 (three) times daily.  270 tablet  3  . insulin glargine (LANTUS) 100 UNIT/ML injection Inject 20 Units into the skin daily.  10 mL    . insulin glargine (LANTUS) 100 UNIT/ML injection Inject 22 Units into the skin at bedtime.      . insulin lispro (HUMALOG) 100 UNIT/ML injection Inject 15 Units into the skin daily as needed. In the morning if CBG>150      . isosorbide mononitrate (IMDUR) 30 MG 24 hr tablet Take 1 tablet (30 mg total) by mouth  daily.  30 tablet  11  . levothyroxine (SYNTHROID, LEVOTHROID) 75 MCG tablet Take 75 mcg by mouth daily.      . nitroGLYCERIN (NITROSTAT) 0.4 MG SL tablet Place 1 tablet (0.4 mg total) under the tongue every 5 (five) minutes x 3 doses as needed for chest pain.  25 tablet  4  . pantoprazole (PROTONIX) 40 MG tablet Take 40 mg by mouth at bedtime.      Marland Kitchen  potassium chloride SA (K-DUR,KLOR-CON) 20 MEQ tablet Take 20 mEq by mouth daily.      . promethazine (PHENERGAN) 12.5 MG suppository Place 12.5 mg rectally every 6 (six) hours as needed. For nausea        No results found for this or any previous visit (from the past 48 hour(s)). No results found.  Review of Systems  Constitutional: Positive for malaise/fatigue. Negative for fever, chills and diaphoresis.  HENT: Negative for ear pain and sore throat.   Eyes: Negative for blurred vision and double vision.  Respiratory: Positive for cough, shortness of breath and wheezing. Negative for sputum production.   Cardiovascular: Positive for claudication and leg swelling. Negative for chest pain, palpitations, orthopnea and PND.  Gastrointestinal: Positive for nausea. Negative for vomiting, abdominal pain, diarrhea, constipation, blood in stool and melena.  Genitourinary: Negative for dysuria, urgency, frequency and hematuria.  Musculoskeletal: Positive for back pain and joint pain.  Skin: Negative for itching and rash.  Neurological: Positive for dizziness and weakness. Negative for focal weakness, seizures, loss of consciousness and headaches.  Endo/Heme/Allergies: Does not bruise/bleed easily.    Temperature 99 F (37.2 C), temperature source Oral, height 5\' 6"  (1.676 m), weight 154 lb 1.6 oz (69.9 kg). Physical Exam  Constitutional: She is oriented to person, place, and time. She appears well-developed and well-nourished. No distress.  HENT:  Head: Normocephalic and atraumatic.  Eyes: Conjunctivae and EOM are normal. Pupils are equal, round,  and reactive to light.  Neck: Normal range of motion. Neck supple. No JVD present. Carotid bruit is not present. No thyromegaly present.  Cardiovascular: An irregularly irregular rhythm present. Exam reveals no gallop and no friction rub.   No murmur heard. Pulses:      Radial pulses are 2+ on the right side, and 2+ on the left side.       Dorsalis pedis pulses are 1+ on the right side, and 1+ on the left side.  Respiratory: Effort normal. No respiratory distress. She has no wheezes. She has rales (bibasilar). She exhibits no tenderness.  GI: Soft. Bowel sounds are normal. She exhibits no distension and no mass. There is no tenderness.  Musculoskeletal: Normal range of motion. She exhibits edema (1+ bilateral LEE L>R).  Lymphadenopathy:    She has no cervical adenopathy.  Neurological: She is alert and oriented to person, place, and time.  Skin: Skin is warm and dry. No rash noted. She is not diaphoretic. No erythema.  Psychiatric: She has a normal mood and affect. Her behavior is normal.     Assessment/Plan Principal Problem:   CHF (congestive heart failure) Active Problems:   PAF. controlled ventricular response at time of admission   Hypertension   CABG 2000. PCI 2/10 to RI, cath 04/10/12- medical Rx. EF 50-55%   PVD, RFA PTA 2/12   Hyperlipemia   DM (diabetes mellitus)   CKD (chronic kidney disease), stage III   CAD (coronary artery disease), stable with cath 04/10/12  Plan: 79 y/o female with known CAD, PAF (on ASA + Plavix), DM, HTN, HLD and PVD admitted directly from [facility] for acute CHF exacerbation. Pre-transfer CXR was suggestive of CHF. BNP was elevated at 15,800. Pt received 1 dose of  80 mg IV Lasix at [place]. Pt now reports improvement in breathing. Physical exam reveals mild bibasilar crackles and 1+ peripheral LEE L>R. Will need further diuresis, however SCr at [facility] was 2.0. Her baseline is ~1.4. Her BP is also borderline hypotensive. Most recent BP  is  96/41. Both renal function and BP may limit diuresis with IV Lasix. Will need to use caution. Will defer decision to MD. All antihypertensives with the exception of low dose BB are on hold. Will place BP and HR parameters for BB.  Her PAF remains rate controlled.  Current HR in the 80s. Will place on IV heparin for AC. Will also cycle troponins. Pt denies chest pain currently and never had chest pain with prior MIs. She does however endorse nausea and belching. She is elderly, female and diabetic. These groups tend to have more atypical presentations. Will r/o MI. Her EKG showed a-fib and some T-wave abnormalities. Will also order another echo to assess LV function and to look for WMAs and valvular abnormalities. Last EF in 2013 was 40-45%. Will check Hgb A1c. Will also need to follow anemia. H/H at [place] was 8.9/28.6. However, she was in this range a year ago. ? If this is chronic. She does have a history of colon cancer. Reports recent "dark stools" in the past several weeks. Will follow CBC. If Hgb trends downward, will need to call GI consult.   Allayne Butcher, PA-C 03/08/2013, 2:50 PM   Patient seen and examined. Agree with assessment and plan.Very pleasant 79 yo WF with DM, CAD, s/p CABG, PVD, who suffered a non-Stemi in 04/2012. She has a h/o AF, not felt to be a coumadin candidate, who now is admitted in transfer from [facility] with CHF decompensation, and edema. ECG with AF at 92 with ILBBB and T wave abnormalities.Currently she is breathing better after diuresis with IV lasix, but BP is low at 88 - 90 systolic precluding additional treatment presently. Will follow renal function with BUN 43 in [place], cycle enzymes, stool guiacs and Hb.Will repeat echo.   Lennette Bihari, MD, [facility] 03/08/2013 5:22 PM

## 2013-03-08 NOTE — Progress Notes (Signed)
Paged SE on call MD about patients BP remaining low.

## 2013-03-08 NOTE — Care Management Note (Addendum)
    Page 1 of 1   03/13/2013     2:24:45 PM   CARE MANAGEMENT NOTE 03/13/2013  Patient:  Catherine Clayton, Catherine Clayton   Account Number:  [id number]  Date Initiated:  03/08/2013  Documentation initiated by:  Junius Creamer  Subjective/Objective Assessment:   adm w chf     Action/Plan:   lives alone, pcp dr Arlan Organ, hx of [facility] home [facility]   Anticipated DC Date:     Anticipated DC Plan:  HOME W HOME HEALTH SERVICES  In-house referral  Clinical Social Worker      DC Planning Services  CM consult      Choice offered to / List presented to:             Status of service:   Medicare Important Message given?   (If response is "NO", the following Medicare IM given date fields will be blank) Date Medicare IM given:   Date Additional Medicare IM given:    Discharge Disposition:    Per UR Regulation:  Reviewed for med. necessity/level of care/duration of stay  If discussed at Long Length of Stay Meetings, dates discussed:    Comments:  6/10  1422 Catherine Antonino Nienhuis rn,bsn nse staes pt alert but concerned that pt cannot live alone anymore. have made sw ref for poss alf vs snf dep on progress.  6/5 1553 Catherine Tiawanna Luchsinger rn,bsn will moniter for dc needs as pt progresses.

## 2013-03-08 NOTE — Progress Notes (Signed)
Pt in via [facility]. CMD & elink notified. MD notified. Will continue to monitor and advise attending as needed.

## 2013-03-09 DIAGNOSIS — I2510 Atherosclerotic heart disease of native coronary artery without angina pectoris: Secondary | ICD-10-CM

## 2013-03-09 DIAGNOSIS — N183 Chronic kidney disease, stage 3 unspecified: Secondary | ICD-10-CM

## 2013-03-09 DIAGNOSIS — I4891 Unspecified atrial fibrillation: Secondary | ICD-10-CM

## 2013-03-09 DIAGNOSIS — I509 Heart failure, unspecified: Secondary | ICD-10-CM

## 2013-03-09 DIAGNOSIS — I5023 Acute on chronic systolic (congestive) heart failure: Secondary | ICD-10-CM

## 2013-03-09 LAB — CBC
MCV: 84.6 fL (ref 78.0–100.0)
Platelets: 166 10*3/uL (ref 150–400)
RBC: 3.32 MIL/uL — ABNORMAL LOW (ref 3.87–5.11)
WBC: 7.4 10*3/uL (ref 4.0–10.5)

## 2013-03-09 LAB — BASIC METABOLIC PANEL
CO2: 21 mEq/L (ref 19–32)
Calcium: 8.9 mg/dL (ref 8.4–10.5)
GFR calc non Af Amer: 18 mL/min — ABNORMAL LOW (ref >90)
Potassium: 4 mEq/L (ref 3.5–5.1)
Sodium: 138 mEq/L (ref 135–145)

## 2013-03-09 LAB — HEPARIN LEVEL (UNFRACTIONATED)
Heparin Unfractionated: 0.64 IU/mL (ref 0.30–0.70)
Heparin Unfractionated: 0.44 IU/mL (ref 0.30–0.70)

## 2013-03-09 LAB — GLUCOSE, CAPILLARY: Glucose-Capillary: 96 mg/dL (ref 70–99)

## 2013-03-09 MED ORDER — DEXTROSE 10 % IV SOLN
INTRAVENOUS | Status: AC
  Administered 2013-03-09: 04:00:00 via INTRAVENOUS

## 2013-03-09 MED ORDER — CARVEDILOL 6.25 MG PO TABS
9.3750 mg | ORAL_TABLET | Freq: Two times a day (BID) | ORAL | Status: DC
  Administered 2013-03-09 – 2013-03-19 (×20): 9.375 mg via ORAL
  Filled 2013-03-09 (×22): qty 1

## 2013-03-09 MED ORDER — DIGOXIN 0.0625 MG HALF TABLET
0.0625 mg | ORAL_TABLET | Freq: Every day | ORAL | Status: DC
  Administered 2013-03-09 – 2013-03-19 (×11): 0.0625 mg via ORAL
  Filled 2013-03-09 (×11): qty 1

## 2013-03-09 MED ORDER — INSULIN GLARGINE 100 UNIT/ML ~~LOC~~ SOLN
10.0000 [IU] | Freq: Every day | SUBCUTANEOUS | Status: DC
  Administered 2013-03-09 – 2013-03-18 (×10): 10 [IU] via SUBCUTANEOUS
  Filled 2013-03-09 (×12): qty 0.1

## 2013-03-09 MED ORDER — FUROSEMIDE 20 MG PO TABS
20.0000 mg | ORAL_TABLET | Freq: Every day | ORAL | Status: DC
  Administered 2013-03-09 – 2013-03-11 (×3): 20 mg via ORAL
  Filled 2013-03-09 (×4): qty 1

## 2013-03-09 NOTE — Progress Notes (Signed)
MD at bedside to assess patient.

## 2013-03-09 NOTE — Progress Notes (Signed)
Repeat CBG result 54. SE cardio on call MD paged for further orders.

## 2013-03-09 NOTE — Progress Notes (Signed)
ANTICOAGULATION CONSULT NOTE - Follow Up Consult  Pharmacy Consult for heparin Indication: atrial fibrillation  Labs:  Recent Labs  03/08/13 1624 03/08/13 2230 03/09/13 0055  HGB 8.2*  --   --   HCT 27.1*  --   --   PLT 170  --   --   HEPARINUNFRC  --   --  0.64  CREATININE 2.14*  --   --   TROPONINI <0.30 <0.30  --     Assessment/Plan:  79yo female therapeutic on heparin with initial dosing for Afib.  Will continue gtt at current rate and confirm stable with additional level.  Vernard Gambles, PharmD, BCPS  03/09/2013,1:57 AM

## 2013-03-09 NOTE — Progress Notes (Signed)
[facility]  Subjective: Feeling better. Out of bed and in a chair. Breathing has improved.   Objective: Vital signs in last 24 hours: Temp:  [97.2 F (36.2 C)-99.3 F (37.4 C)] 97.2 F (36.2 C) (06/06 0746) Pulse Rate:  [34-108] 101 (06/06 0900) Resp:  [12-26] 19 (06/06 0900) BP: (77-129)/(33-89) 116/68 mmHg (06/06 0900) SpO2:  [89 %-99 %] 92 % (06/06 0900) FiO2 (%):  [21 %] 21 % (06/05 1529) Weight:  [150 lb 5.7 oz (68.2 kg)-154 lb 1.6 oz (69.9 kg)] 150 lb 5.7 oz (68.2 kg) (06/06 0500)    Intake/Output from previous day: 06/05 0701 - 06/06 0700 In: 853.8 [P.O.:500; I.V.:353.8] Out: 975 [Urine:975] Intake/Output this shift: Total I/O In: 84 [I.V.:84] Out: 350 [Urine:350]  Medications Current Facility-Administered Medications  Medication Dose Route Frequency Provider Last Rate Last Dose  . 0.9 %  sodium chloride infusion  250 mL Intravenous PRN Brittainy Simmons, PA-C      . acetaminophen (TYLENOL) tablet 650 mg  650 mg Oral Q4H PRN Brittainy Simmons, PA-C      . ALPRAZolam (XANAX) tablet 0.25 mg  0.25 mg Oral BID PRN Brittainy Simmons, PA-C      . aspirin EC tablet 81 mg  81 mg Oral Daily Brittainy Simmons, PA-C   81 mg at 03/09/13 0924  . atorvastatin (LIPITOR) tablet 40 mg  40 mg Oral Daily Brittainy Simmons, PA-C   40 mg at 03/09/13 0924  . calcium-vitamin D (OSCAL WITH D) 500-200 MG-UNIT per tablet 1 tablet  1 tablet Oral Daily Brittainy Simmons, PA-C   1 tablet at 03/09/13 0924  . carvedilol (COREG) tablet 6.25 mg  6.25 mg Oral BID WC Brittainy Simmons, PA-C   6.25 mg at 03/09/13 0924  . gabapentin (NEURONTIN) capsule 100 mg  100 mg Oral QHS Brittainy Simmons, PA-C   100 mg at 03/08/13 2156  . heparin ADULT infusion 100 units/mL (25000 units/250 mL)  900 Units/hr Intravenous Continuous Lennette Bihari, MD 9 mL/hr at 03/08/13 1900 900 Units/hr at 03/08/13 1900  . insulin aspart (novoLOG) injection 0-9 Units  0-9 Units Subcutaneous TID WC  Brittainy Simmons, PA-C   1 Units at 03/09/13 0800  . insulin glargine (LANTUS) injection 22 Units  22 Units Subcutaneous QHS Brittainy Simmons, PA-C   22 Units at 03/08/13 2201  . isosorbide mononitrate (IMDUR) 24 hr tablet 30 mg  30 mg Oral Daily Brittainy Simmons, PA-C   30 mg at 03/09/13 0924  . levothyroxine (SYNTHROID, LEVOTHROID) tablet 75 mcg  75 mcg Oral QAC breakfast Brittainy Simmons, PA-C   75 mcg at 03/09/13 0924  . nitroGLYCERIN (NITROSTAT) SL tablet 0.4 mg  0.4 mg Sublingual Q5 Min x 3 PRN Brittainy Simmons, PA-C      . ondansetron (ZOFRAN) injection 4 mg  4 mg Intravenous Q6H PRN Brittainy Simmons, PA-C      . pantoprazole (PROTONIX) EC tablet 40 mg  40 mg Oral QHS Brittainy Simmons, PA-C   40 mg at 03/08/13 2156  . potassium chloride SA (K-DUR,KLOR-CON) CR tablet 20 mEq  20 mEq Oral Daily Brittainy Simmons, PA-C   20 mEq at 03/09/13 0924  . sodium chloride 0.9 % injection 3 mL  3 mL Intravenous Q12H Brittainy Simmons, PA-C   3 mL at 03/08/13 2156  . sodium chloride 0.9 % injection 3 mL  3 mL Intravenous PRN Brittainy Simmons, PA-C      . zolpidem (AMBIEN) tablet 5 mg  5 mg Oral QHS PRN  Robbie Lis, PA-C        PE: General appearance: alert, cooperative and no distress No JVD Lungs: minimal bibasilar crackles, much improved from 1 day ago Heart: irregularly irregular rhythm 1/6 sem Extremities: 1+ LEE on the Left. Trace LEE on the right Pulses: 2+ and symmetric Skin: warm and dry Neurologic: Grossly normal  Lab Results:   Recent Labs  03/08/13 1624 03/09/13 0415  WBC 5.4 7.4  HGB 8.2* 8.7*  HCT 27.1* 28.1*  PLT 170 166   BMET  Recent Labs  03/08/13 1624 03/09/13 0415  NA 138 138  K 4.2 4.0  CL 107 104  CO2 23 21  GLUCOSE 93 52*  BUN 42* 42*  CREATININE 2.14* 2.26*  CALCIUM 8.9 8.9   Cardiac Enzymes Cardiac Panel (last 3 results)  Recent Labs  03/08/13 1624 03/08/13 2230 03/09/13 0415  TROPONINI <0.30 <0.30 <0.30   BNP    Component  Value Date/Time   PROBNP 10430.0* 03/09/2013 0415   *Note* BNP prior to transfer from [facility] was 15,800  Assessment/Plan  Principal Problem:   CHF (congestive heart failure) Active Problems:   PAF. controlled ventricular response at time of admission   Hypertension   CABG 2000. PCI 2/10 to RI, cath 04/10/12- medical Rx. EF 50-55%   PVD, RFA PTA 2/12   Hyperlipemia   DM (diabetes mellitus)   CKD (chronic kidney disease), stage III   CAD (coronary artery disease), stable with cath 04/10/12  Plan: BNP has improved to 10,430 (15,800 prior to transfer). Pt received 40 mg IV lasix last PM and 80 mg prior to transfer. She is out of bed and up in a chair. She reports her breathing has improved. Peripheral edema has improved. However, renal function continues to increase. SCr. is 2.26 today (2.0 prior to transfer). Will check with MD to see if additional lasix is safe to give. Her BP has stabilized. Troponins negative x 3. She remains in a-fib, but rates are controlled in the 80s. Continue on BB and Heparin. She is anemic but H/H is stable. Will continue to monitor. Will need outpatient spinal specialist referral at time of discharge.      LOS: 1 day    Brittainy M. Delmer Islam 03/09/2013 10:18 AM   Patient seen and examined. Agree with assessment and plan. Breathing better. -121.4 output yesterday. Cr slightly increased. Trop negative. AF rate 90 - 105. Will titrate carvedilol to 9.375 mg bid today and add lanoxin 0.0625 mg daily. F/U renal function. Change lasix to 20 mg oral today.   Lennette Bihari, MD, [facility] 03/09/2013 1:54 PM

## 2013-03-09 NOTE — Progress Notes (Signed)
Patient called me into room stated she did not feel good and she felt funny. Patient noted to be a little shaky, diaphoretic, cool, and pale. CBG attained with result of 35. Two orange juices given with a packet of sugar in each and a pack of graham crackers given. Will recheck CBG.

## 2013-03-09 NOTE — Progress Notes (Signed)
Notified on call SE MD about 5beat v tach of pvcs. No new orders given.

## 2013-03-09 NOTE — Progress Notes (Signed)
Inpatient Diabetes Program Recommendations  AACE/ADA: New Consensus Statement on Inpatient Glycemic Control (2013)  Target Ranges:  Prepandial:   less than 140 mg/dL      Peak postprandial:   less than 180 mg/dL (1-2 hours)      Critically ill patients:  140 - 180 mg/dL   Reason for Visit: Results for Catherine Clayton, Catherine Clayton (MRN [id number]) as of 03/09/2013 11:03  Ref. Range 03/08/2013 21:55 03/09/2013 03:05 03/09/2013 03:34 03/09/2013 04:19 03/09/2013 05:22  Glucose-Capillary Latest Range: 70-99 mg/dL 956 (H) 35 (LL) 54 (L) 76 115 (H)   Note CBG low this morning.  According to Medication reconciliation, patient had received her home dose of Lantus yesterday morning.  She also received 22 units of Lantus last night which could have contributed to low CBG.  Consider reducing Lantus to 12 units q HS.       Note: Will follow.

## 2013-03-09 NOTE — Progress Notes (Signed)
ANTICOAGULATION CONSULT NOTE - Follow-Up Consult  Pharmacy Consult for heparin  Indication: r/o ACS  Allergies  Allergen Reactions  . Influenza Vaccines Swelling  . Demerol Nausea And Vomiting  . Promethazine Other (See Comments)    Pt. Unsure  . Zithromax (Azithromycin) Nausea And Vomiting  . Penicillins Rash    Patient Measurements: Height: 5\' 6"  (167.6 cm) Weight: 150 lb 5.7 oz (68.2 kg) IBW/kg (Calculated) : 59.3  Vital Signs: Temp: 97.2 F (36.2 C) (06/06 0746) Temp src: Oral (06/06 0746) BP: 129/51 mmHg (06/06 0800) Pulse Rate: 92 (06/06 0800)  Labs:  Recent Labs  03/08/13 1624 03/08/13 2230 03/09/13 0055 03/09/13 0415 03/09/13 0830  HGB 8.2*  --   --  8.7*  --   HCT 27.1*  --   --  28.1*  --   PLT 170  --   --  166  --   HEPARINUNFRC  --   --  0.64  --  0.44  CREATININE 2.14*  --   --  2.26*  --   TROPONINI <0.30 <0.30  --  <0.30  --     Estimated Creatinine Clearance: 16.7 ml/min (by C-G formula based on Cr of 2.26).  Assessment: 79 y.o. female on heparin for r/o ACS. Heparin level remains therapeutic 0.44. No bleeding noted. Trop remain negative. Noted in past, pt not felt to be coumadin candidate for afib.  Goal of Therapy:  Heparin level 0.3-0.7 units/ml Monitor platelets by anticoagulation protocol: Yes   Plan:  1) Continue Heparin drip at 900 units/hr 2) Daily HL, CBC   Christoper Fabian, PharmD, BCPS Clinical pharmacist, pager [phone] 03/09/2013 9:58 AM

## 2013-03-10 DIAGNOSIS — I447 Left bundle-branch block, unspecified: Secondary | ICD-10-CM

## 2013-03-10 LAB — OCCULT BLOOD X 1 CARD TO LAB, STOOL: Fecal Occult Bld: NEGATIVE

## 2013-03-10 LAB — CBC
HCT: 27 % — ABNORMAL LOW (ref 36.0–46.0)
MCV: 84.9 fL (ref 78.0–100.0)
Platelets: 162 10*3/uL (ref 150–400)
RBC: 3.18 MIL/uL — ABNORMAL LOW (ref 3.87–5.11)
WBC: 5.4 10*3/uL (ref 4.0–10.5)

## 2013-03-10 LAB — BASIC METABOLIC PANEL
CO2: 23 mEq/L (ref 19–32)
Chloride: 103 mEq/L (ref 96–112)
Sodium: 137 mEq/L (ref 135–145)

## 2013-03-10 LAB — GLUCOSE, CAPILLARY
Glucose-Capillary: 152 mg/dL — ABNORMAL HIGH (ref 70–99)
Glucose-Capillary: 127 mg/dL — ABNORMAL HIGH (ref 70–99)

## 2013-03-10 NOTE — Progress Notes (Signed)
ANTICOAGULATION CONSULT NOTE - Follow-Up Consult  Pharmacy Consult for heparin  Indication: r/o ACS  Allergies  Allergen Reactions  . Influenza Vaccines Swelling  . Demerol Nausea And Vomiting  . Promethazine Other (See Comments)    Pt. Unsure  . Zithromax (Azithromycin) Nausea And Vomiting  . Penicillins Rash    Patient Measurements: Height: 5\' 6"  (167.6 cm) Weight: 148 lb 13 oz (67.5 kg) IBW/kg (Calculated) : 59.3  Vital Signs: Temp: 98 F (36.7 C) (06/07 0740) Temp src: Oral (06/07 0740) BP: 121/70 mmHg (06/07 0740) Pulse Rate: 77 (06/07 0740)  Labs:  Recent Labs  03/08/13 1624 03/08/13 2230 03/09/13 0055 03/09/13 0415 03/09/13 0830 03/10/13 0420  HGB 8.2*  --   --  8.7*  --  8.3*  HCT 27.1*  --   --  28.1*  --  27.0*  PLT 170  --   --  166  --  162  HEPARINUNFRC  --   --  0.64  --  0.44 0.65  CREATININE 2.14*  --   --  2.26*  --  2.08*  TROPONINI <0.30 <0.30  --  <0.30  --   --     Estimated Creatinine Clearance: 18.2 ml/min (by C-G formula based on Cr of 2.08).  Assessment: 79 y.o. female on heparin for r/o ACS. Heparin level remains therapeutic 0.65. No bleeding noted. Trop remain negative. Noted in past, pt not felt to be coumadin candidate for afib.  Goal of Therapy:  Heparin level 0.3-0.7 units/ml Monitor platelets by anticoagulation protocol: Yes   Plan:  1) Continue Heparin drip at 900 units/hr 2) Daily HL, CBC   Vania Rea. Darin Engels.D. Clinical Pharmacist Pager [phone] Phone [phone] 03/10/2013 8:23 AM

## 2013-03-10 NOTE — Progress Notes (Addendum)
[facility]  Subjective: "Feeling better each day". No complaints.  Objective: Vital signs in last 24 hours: Temp:  [97.9 F (36.6 C)-99 F (37.2 C)] 98 F (36.7 C) (06/07 0740) Pulse Rate:  [37-108] 77 (06/07 0740) Resp:  [10-28] 21 (06/07 0740) BP: (89-121)/(52-84) 121/70 mmHg (06/07 0740) SpO2:  [92 %-99 %] 95 % (06/07 0740) Weight:  [148 lb 13 oz (67.5 kg)] 148 lb 13 oz (67.5 kg) (06/07 0500) Last BM Date: 03/09/13  Intake/Output from previous day: 06/06 0701 - 06/07 0700 In: 1087 [P.O.:520; I.V.:567] Out: 1000 [Urine:1000] Intake/Output this shift:    Medications Current Facility-Administered Medications  Medication Dose Route Frequency Provider Last Rate Last Dose  . 0.9 %  sodium chloride infusion  250 mL Intravenous PRN Brittainy Simmons, PA-C      . acetaminophen (TYLENOL) tablet 650 mg  650 mg Oral Q4H PRN Brittainy Simmons, PA-C      . ALPRAZolam Prudy Feeler) tablet 0.25 mg  0.25 mg Oral BID PRN Robbie Lis, PA-C   0.25 mg at 03/09/13 2339  . aspirin EC tablet 81 mg  81 mg Oral Daily Brittainy Simmons, PA-C   81 mg at 03/09/13 0924  . atorvastatin (LIPITOR) tablet 40 mg  40 mg Oral Daily Brittainy Simmons, PA-C   40 mg at 03/09/13 0924  . calcium-vitamin D (OSCAL WITH D) 500-200 MG-UNIT per tablet 1 tablet  1 tablet Oral Daily Brittainy Simmons, PA-C   1 tablet at 03/09/13 0924  . carvedilol (COREG) tablet 9.375 mg  9.375 mg Oral BID WC Lennette Bihari, MD   9.375 mg at 03/09/13 1810  . digoxin (LANOXIN) tablet 0.0625 mg  0.0625 mg Oral Daily Lennette Bihari, MD   0.0625 mg at 03/09/13 1637  . furosemide (LASIX) tablet 20 mg  20 mg Oral Daily Lennette Bihari, MD   20 mg at 03/09/13 1637  . gabapentin (NEURONTIN) capsule 100 mg  100 mg Oral QHS Brittainy Simmons, PA-C   100 mg at 03/09/13 2339  . heparin ADULT infusion 100 units/mL (25000 units/250 mL)  900 Units/hr Intravenous Continuous Lennette Bihari, MD 9 mL/hr at 03/08/13 1900 900  Units/hr at 03/08/13 1900  . insulin aspart (novoLOG) injection 0-9 Units  0-9 Units Subcutaneous TID WC Brittainy Simmons, PA-C   2 Units at 03/09/13 1327  . insulin glargine (LANTUS) injection 10 Units  10 Units Subcutaneous QHS Brittainy Simmons, PA-C   10 Units at 03/09/13 2340  . isosorbide mononitrate (IMDUR) 24 hr tablet 30 mg  30 mg Oral Daily Brittainy Simmons, PA-C   30 mg at 03/09/13 0924  . levothyroxine (SYNTHROID, LEVOTHROID) tablet 75 mcg  75 mcg Oral QAC breakfast Brittainy Simmons, PA-C   75 mcg at 03/09/13 0924  . nitroGLYCERIN (NITROSTAT) SL tablet 0.4 mg  0.4 mg Sublingual Q5 Min x 3 PRN Brittainy Simmons, PA-C      . ondansetron (ZOFRAN) injection 4 mg  4 mg Intravenous Q6H PRN Brittainy Simmons, PA-C      . pantoprazole (PROTONIX) EC tablet 40 mg  40 mg Oral QHS Brittainy Simmons, PA-C   40 mg at 03/09/13 2204  . potassium chloride SA (K-DUR,KLOR-CON) CR tablet 20 mEq  20 mEq Oral Daily Brittainy Simmons, PA-C   20 mEq at 03/09/13 0924  . sodium chloride 0.9 % injection 3 mL  3 mL Intravenous Q12H Brittainy Simmons, PA-C   3 mL at 03/09/13 2200  . sodium chloride 0.9 % injection 3 mL  3 mL Intravenous PRN Brittainy Simmons, PA-C      . zolpidem (AMBIEN) tablet 5 mg  5 mg Oral QHS PRN Brittainy Simmons, PA-C   5 mg at 03/10/13 0026    PE: General appearance: alert, cooperative and no distress Lungs: inspiratory wheezing bilaterally Heart: irregularly irregular rhythm Extremities: trace left sided LEE Pulses: 2+ and symmetric Skin: warm and dry Neurologic: Grossly normal  Lab Results:   Recent Labs  03/08/13 1624 03/09/13 0415 03/10/13 0420  WBC 5.4 7.4 5.4  HGB 8.2* 8.7* 8.3*  HCT 27.1* 28.1* 27.0*  PLT 170 166 162   BMET  Recent Labs  03/08/13 1624 03/09/13 0415 03/10/13 0420  NA 138 138 137  K 4.2 4.0 4.1  CL 107 104 103  CO2 23 21 23   GLUCOSE 93 52* 75  BUN 42* 42* 39*  CREATININE 2.14* 2.26* 2.08*  CALCIUM 8.9 8.9 8.9   BNP    Component  Value Date/Time   PROBNP 10430.0* 03/09/2013 0415    Assessment/Plan  Principal Problem:   CHF (congestive heart failure) Active Problems:   PAF. controlled ventricular response at time of admission   Hypertension   CABG 2000. PCI 2/10 to RI, cath 04/10/12- medical Rx. EF 50-55%   PVD, RFA PTA 2/12   Hyperlipemia   DM (diabetes mellitus)   CKD (chronic kidney disease), stage III   CAD (coronary artery disease), stable with cath 04/10/12  Plan: Pt reports improvement in symptoms. She is breathing better and LEE has improved. Pt was transitioned to 20 mg of PO Lasix daily yesterday. Her renal function has improved. SCr today is 2.08 (2.26 yesterday). BNP had improved yesterday from 15,800 to 10,430. Will reassess tomorrow. Her a-fib is controlled, with HR currently in the 80s. Her BB was increased yesterday and Lanoxin was added.  Her BP is stable. She continues to be anemic. Hgb has been in the 8s this hospitalization. It was also in the 8-9 range in 2013. ? If this chronic. ? The need for iron studies or stool guaiac. Pt does have a remote history of rectal cancer. MD to follow with recommendation.     Catherine Clayton 03/10/2013 8:33 AM   Patient seen and examined. Agree with assessment and plan. Breathing better. I/O -284 since admission. Cr improved since yesterday to 2.08. Will increase furosemide to 40 mg orally. Increase carvedilol to 12.5 mg bid. Check stool guaiacs. Follow up ECG shows AF, ocasional ectopic beat, LBBB.   Lennette Bihari, MD, [employer] 03/10/2013 9:27 AM

## 2013-03-11 LAB — HEPARIN LEVEL (UNFRACTIONATED): Heparin Unfractionated: 0.67 IU/mL (ref 0.30–0.70)

## 2013-03-11 LAB — CBC
HCT: 27.7 % — ABNORMAL LOW (ref 36.0–46.0)
MCV: 85.5 fL (ref 78.0–100.0)
Platelets: 171 10*3/uL (ref 150–400)
RBC: 3.24 MIL/uL — ABNORMAL LOW (ref 3.87–5.11)
WBC: 5.1 10*3/uL (ref 4.0–10.5)

## 2013-03-11 LAB — GLUCOSE, CAPILLARY
Glucose-Capillary: 106 mg/dL — ABNORMAL HIGH (ref 70–99)
Glucose-Capillary: 100 mg/dL — ABNORMAL HIGH (ref 70–99)
Glucose-Capillary: 161 mg/dL — ABNORMAL HIGH (ref 70–99)

## 2013-03-11 LAB — BASIC METABOLIC PANEL
CO2: 24 mEq/L (ref 19–32)
Chloride: 105 mEq/L (ref 96–112)
GFR calc Af Amer: 23 mL/min — ABNORMAL LOW (ref >90)
Sodium: 138 mEq/L (ref 135–145)

## 2013-03-11 LAB — PRO B NATRIURETIC PEPTIDE: Pro B Natriuretic peptide (BNP): 13321 pg/mL — ABNORMAL HIGH (ref 0–450)

## 2013-03-11 NOTE — Progress Notes (Signed)
[facility]  Subjective: Pt states, "I don't feel good this morning". States that she is more tired and fatigued.   Objective: Vital signs in last 24 hours: Temp:  [97.9 F (36.6 C)-99.1 F (37.3 C)] 97.9 F (36.6 C) (06/08 0801) Pulse Rate:  [65-107] 65 (06/08 0801) Resp:  [14-23] 21 (06/08 0801) BP: (95-144)/(36-74) 115/52 mmHg (06/08 0801) SpO2:  [80 %-96 %] 96 % (06/08 0801) Weight:  [150 lb 5.7 oz (68.2 kg)] 150 lb 5.7 oz (68.2 kg) (06/08 0314) Last BM Date: 03/09/13  Intake/Output from previous day: 06/07 0701 - 06/08 0700 In: 1097 [P.O.:660; I.V.:437] Out: 1901 [Urine:1900; Stool:1] Intake/Output this shift:    Medications Current Facility-Administered Medications  Medication Dose Route Frequency Provider Last Rate Last Dose  . 0.9 %  sodium chloride infusion  250 mL Intravenous PRN Brittainy Simmons, PA-C 10 mL/hr at 03/10/13 1834    . acetaminophen (TYLENOL) tablet 650 mg  650 mg Oral Q4H PRN Brittainy Simmons, PA-C      . ALPRAZolam Prudy Feeler) tablet 0.25 mg  0.25 mg Oral BID PRN Robbie Lis, PA-C   0.25 mg at 03/10/13 2311  . aspirin EC tablet 81 mg  81 mg Oral Daily Brittainy Simmons, PA-C   81 mg at 03/10/13 1045  . atorvastatin (LIPITOR) tablet 40 mg  40 mg Oral Daily Brittainy Simmons, PA-C   40 mg at 03/10/13 1045  . calcium-vitamin D (OSCAL WITH D) 500-200 MG-UNIT per tablet 1 tablet  1 tablet Oral Daily Brittainy Simmons, PA-C   1 tablet at 03/10/13 1046  . carvedilol (COREG) tablet 9.375 mg  9.375 mg Oral BID WC Lennette Bihari, MD   9.375 mg at 03/11/13 0748  . digoxin (LANOXIN) tablet 0.0625 mg  0.0625 mg Oral Daily Lennette Bihari, MD   0.0625 mg at 03/10/13 1045  . furosemide (LASIX) tablet 20 mg  20 mg Oral Daily Lennette Bihari, MD   20 mg at 03/10/13 1045  . gabapentin (NEURONTIN) capsule 100 mg  100 mg Oral QHS Brittainy Simmons, PA-C   100 mg at 03/10/13 2148  . heparin ADULT infusion 100 units/mL (25000 units/250 mL)   800 Units/hr Intravenous Continuous Lennette Bihari, MD 8 mL/hr at 03/11/13 0744 800 Units/hr at 03/11/13 0744  . insulin aspart (novoLOG) injection 0-9 Units  0-9 Units Subcutaneous TID WC Brittainy Simmons, PA-C   2 Units at 03/10/13 1838  . insulin glargine (LANTUS) injection 10 Units  10 Units Subcutaneous QHS Brittainy Simmons, PA-C   10 Units at 03/10/13 2148  . isosorbide mononitrate (IMDUR) 24 hr tablet 30 mg  30 mg Oral Daily Brittainy Simmons, PA-C   30 mg at 03/10/13 1045  . levothyroxine (SYNTHROID, LEVOTHROID) tablet 75 mcg  75 mcg Oral QAC breakfast Brittainy Simmons, PA-C   75 mcg at 03/11/13 0748  . nitroGLYCERIN (NITROSTAT) SL tablet 0.4 mg  0.4 mg Sublingual Q5 Min x 3 PRN Brittainy Simmons, PA-C      . ondansetron (ZOFRAN) injection 4 mg  4 mg Intravenous Q6H PRN Brittainy Simmons, PA-C   4 mg at 03/10/13 1755  . pantoprazole (PROTONIX) EC tablet 40 mg  40 mg Oral QHS Brittainy Simmons, PA-C   40 mg at 03/10/13 2148  . potassium chloride SA (K-DUR,KLOR-CON) CR tablet 20 mEq  20 mEq Oral Daily Brittainy Simmons, PA-C   20 mEq at 03/10/13 1045  . sodium chloride 0.9 % injection 3 mL  3 mL Intravenous Q12H Brittainy  Simmons, PA-C   3 mL at 03/10/13 1046  . sodium chloride 0.9 % injection 3 mL  3 mL Intravenous PRN Brittainy Simmons, PA-C      . zolpidem (AMBIEN) tablet 5 mg  5 mg Oral QHS PRN Brittainy Simmons, PA-C   5 mg at 03/10/13 2148    PE: General appearance: alert and cooperative but notably less energetic today,  no distress Lungs: mild + rales in the LLL Heart: irregularly irregular rhythm Extremities: trace LLE in the left leg Pulses: 2+ and symmetric Skin: warm and dry Neurologic: Grossly normal  Lab Results:   Recent Labs  03/09/13 0415 03/10/13 0420 03/11/13 0420  WBC 7.4 5.4 5.1  HGB 8.7* 8.3* 8.6*  HCT 28.1* 27.0* 27.7*  PLT 166 162 171   BMET  Recent Labs  03/09/13 0415 03/10/13 0420 03/11/13 0420  NA 138 137 138  K 4.0 4.1 4.7  CL 104 103  105  CO2 21 23 24   GLUCOSE 52* 75 126*  BUN 42* 39* 35*  CREATININE 2.26* 2.08* 2.11*  CALCIUM 8.9 8.9 8.7   BNP (last 3 results)  Recent Labs  03/08/13 1624 03/09/13 0415 03/11/13 0420  PROBNP 11058.0* 10430.0* 13321.0*    Assessment/Plan  Principal Problem:   CHF (congestive heart failure) Active Problems:   PAF. controlled ventricular response at time of admission   Hypertension   CABG 2000. PCI 2/10 to RI, cath 04/10/12- medical Rx. EF 50-55%   PVD, RFA PTA 2/12   Hyperlipemia   DM (diabetes mellitus)   CKD (chronic kidney disease), stage III   CAD (coronary artery disease), stable with cath 04/10/12  Plan: Pt appears notabley less energetic and fatigued today than yesterday. Per RN, she hasn't slept well since admission and was given Ambien and Xanax last PM.  A-fib remains controlled in the 70s. BNP is actually up from 2 days ago at 13,321 (10,430 yesterday). BNP prior to transfer from [place], 3 days ago, was 15,800. Her lungs sound improved on exam, with only minimal rales in the left lower base. LEE has improved with only trace edema in the left LE. I/Os since admission is -819.3. Pt is currently on 40 mg PO Lasix daily. SCr is slightly up from 1 day ago at 2.11 (2.08). Pt's anemia appears to be stable. H/H is 8.6/27.7. FOBT yesterday was negative. MD to assess and will provide further recommendation.     LOS: 3 days    Brittainy M. Delmer Islam 03/11/2013 8:47 AM   Patient seen and examined. Agree with assessment and plan. Lethargic; apparently had received both Xanax and Ambien yesterday. Will dc xanax. Not aggressively diuresing; will increase lasix to 40 mg bid. Stool guaiac negative.    Lennette Bihari, MD, [facility] 03/11/2013 9:43 AM

## 2013-03-11 NOTE — Progress Notes (Signed)
ANTICOAGULATION CONSULT NOTE - Follow-Up Consult  Pharmacy Consult for heparin  Indication: r/o ACS  Allergies  Allergen Reactions  . Influenza Vaccines Swelling  . Demerol Nausea And Vomiting  . Promethazine Other (See Comments)    Pt. Unsure  . Zithromax (Azithromycin) Nausea And Vomiting  . Penicillins Rash    Patient Measurements: Height: 5\' 6"  (167.6 cm) Weight: 150 lb 5.7 oz (68.2 kg) IBW/kg (Calculated) : 59.3  Vital Signs: Temp: 98.2 F (36.8 C) (06/08 0314) Temp src: Oral (06/08 0314) BP: 108/40 mmHg (06/08 0400) Pulse Rate: 93 (06/08 0400)  Labs:  Recent Labs  03/08/13 1624 03/08/13 2230  03/09/13 0415 03/09/13 0830 03/10/13 0420 03/11/13 0420  HGB 8.2*  --   --  8.7*  --  8.3* 8.6*  HCT 27.1*  --   --  28.1*  --  27.0* 27.7*  PLT 170  --   --  166  --  162 171  HEPARINUNFRC  --   --   < >  --  0.44 0.65 0.72*  CREATININE 2.14*  --   --  2.26*  --  2.08* 2.11*  TROPONINI <0.30 <0.30  --  <0.30  --   --   --   < > = values in this interval not displayed.  Estimated Creatinine Clearance: 17.9 ml/min (by C-G formula based on Cr of 2.11).  Assessment: 79 y.o. female on heparin for r/o ACS. Heparin now slightly  supratherapeutic at 0.72. No bleeding noted. Trop remain negative. Noted in past, pt not felt to be coumadin candidate for afib.  Goal of Therapy:  Heparin level 0.3-0.7 units/ml Monitor platelets by anticoagulation protocol: Yes   Plan:  1) Decrease Heparin drip at 800 units/hr 2) Heparin level in 8 hours 3) Daily HL, CBC   Vania Rea. Darin Engels.D. Clinical Pharmacist Pager [phone] Phone [phone] 03/11/2013 7:23 AM

## 2013-03-11 NOTE — Progress Notes (Signed)
ANTICOAGULATION CONSULT NOTE - Follow Up Consult  Pharmacy Consult for Heparin Indication: r/o ACS  Allergies  Allergen Reactions  . Influenza Vaccines Swelling  . Demerol Nausea And Vomiting  . Promethazine Other (See Comments)    Pt. Unsure  . Zithromax (Azithromycin) Nausea And Vomiting  . Penicillins Rash    Patient Measurements: Height: 5\' 6"  (167.6 cm) Weight: 150 lb 5.7 oz (68.2 kg) IBW/kg (Calculated) : 59.3 Heparin Dosing Weight: 59.3kg  Vital Signs: Temp: 98.8 F (37.1 C) (06/08 1600) Temp src: Oral (06/08 1600) BP: 107/44 mmHg (06/08 1700) Pulse Rate: 36 (06/08 1700)  Labs:  Recent Labs  03/08/13 2230  03/09/13 0415  03/10/13 0420 03/11/13 0420 03/11/13 1545  HGB  --   < > 8.7*  --  8.3* 8.6*  --   HCT  --   --  28.1*  --  27.0* 27.7*  --   PLT  --   --  166  --  162 171  --   HEPARINUNFRC  --   < >  --   < > 0.65 0.72* 0.67  CREATININE  --   --  2.26*  --  2.08* 2.11*  --   TROPONINI <0.30  --  <0.30  --   --   --   --   < > = values in this interval not displayed.  Estimated Creatinine Clearance: 17.9 ml/min (by C-G formula based on Cr of 2.11).   Medications:  Heparin 800 units/hr  Assessment: 88yof continues on heparin for r/o ACS. Heparin level (0.67) has trended down to goal range with rate reduction - will continue with current rate and follow-up AM heparin level. - H/H and Plts stable - No significant bleeding reported  Goal of Therapy:  Heparin level 0.3-0.7 units/ml Monitor platelets by anticoagulation protocol: Yes   Plan:  1. Continue heparin drip 800 units/hr ( 8 ml/hr) 2. Follow-up AM heparin level  Cleon Dew [phone] 03/11/2013,7:12 PM

## 2013-03-11 NOTE — Progress Notes (Signed)
Sitting in recliner,finished eating ( most of) dinner meal. the patient is pleasant ,conversive,smiling .

## 2013-03-11 NOTE — Progress Notes (Signed)
ANTICOAGULATION CONSULT NOTE - Follow-Up Consult  Pharmacy Consult for heparin  Indication: r/o ACS  Allergies  Allergen Reactions  . Influenza Vaccines Swelling  . Demerol Nausea And Vomiting  . Promethazine Other (See Comments)    Pt. Unsure  . Zithromax (Azithromycin) Nausea And Vomiting  . Penicillins Rash    Patient Measurements: Height: 5\' 6"  (167.6 cm) Weight: 150 lb 5.7 oz (68.2 kg) IBW/kg (Calculated) : 59.3  Vital Signs: Temp: 98.8 F (37.1 C) (06/08 1600) Temp src: Oral (06/08 1600) BP: 107/44 mmHg (06/08 1700) Pulse Rate: 36 (06/08 1700)  Labs:  Recent Labs  03/08/13 2230  03/09/13 0415  03/10/13 0420 03/11/13 0420 03/11/13 1545  HGB  --   < > 8.7*  --  8.3* 8.6*  --   HCT  --   --  28.1*  --  27.0* 27.7*  --   PLT  --   --  166  --  162 171  --   HEPARINUNFRC  --   < >  --   < > 0.65 0.72* 0.67  CREATININE  --   --  2.26*  --  2.08* 2.11*  --   TROPONINI <0.30  --  <0.30  --   --   --   --   < > = values in this interval not displayed.  Estimated Creatinine Clearance: 17.9 ml/min (by C-G formula based on Cr of 2.11).  Assessment: 79 y.o. female on heparin for r/o ACS. Heparin level is therapeutic at  now slightly  supratherapeutic at 0.72. No bleeding noted. Trop remain negative. Noted in past, pt not felt to be coumadin candidate for afib.  Goal of Therapy:  Heparin level 0.3-0.7 units/ml Monitor platelets by anticoagulation protocol: Yes   Plan:  1) Decrease Heparin drip at 800 units/hr 2) Heparin level in 8 hours 3) Daily HL, CBC   Vania Rea. Darin Engels.D. Clinical Pharmacist Pager [phone] Phone [phone] 03/11/2013 7:15 PM

## 2013-03-12 LAB — BASIC METABOLIC PANEL
BUN: 32 mg/dL — ABNORMAL HIGH (ref 6–23)
CO2: 23 mEq/L (ref 19–32)
Calcium: 8.9 mg/dL (ref 8.4–10.5)
Chloride: 106 mEq/L (ref 96–112)
Creatinine, Ser: 1.95 mg/dL — ABNORMAL HIGH (ref 0.50–1.10)
GFR calc Af Amer: 26 mL/min — ABNORMAL LOW (ref >90)
GFR calc non Af Amer: 22 mL/min — ABNORMAL LOW (ref >90)
Glucose, Bld: 117 mg/dL — ABNORMAL HIGH (ref 70–99)
Potassium: 4.6 mEq/L (ref 3.5–5.1)
Sodium: 138 mEq/L (ref 135–145)

## 2013-03-12 LAB — CBC
HCT: 27.5 % — ABNORMAL LOW (ref 36.0–46.0)
Hemoglobin: 8.4 g/dL — ABNORMAL LOW (ref 12.0–15.0)
MCH: 25.8 pg — ABNORMAL LOW (ref 26.0–34.0)
MCHC: 30.5 g/dL (ref 30.0–36.0)
RDW: 18.1 % — ABNORMAL HIGH (ref 11.5–15.5)

## 2013-03-12 LAB — GLUCOSE, CAPILLARY: Glucose-Capillary: 168 mg/dL — ABNORMAL HIGH (ref 70–99)

## 2013-03-12 MED ORDER — ALPRAZOLAM 0.25 MG PO TABS
0.2500 mg | ORAL_TABLET | Freq: Every evening | ORAL | Status: DC | PRN
  Administered 2013-03-12 – 2013-03-19 (×2): 0.25 mg via ORAL
  Filled 2013-03-12 (×4): qty 1

## 2013-03-12 MED ORDER — MORPHINE SULFATE 2 MG/ML IJ SOLN: 2.0000 mg | INTRAMUSCULAR | Status: DC | PRN

## 2013-03-12 MED ORDER — TRAZODONE HCL 50 MG PO TABS
50.0000 mg | ORAL_TABLET | Freq: Every day | ORAL | Status: AC
  Administered 2013-03-13: 50 mg via ORAL
  Filled 2013-03-12: qty 1

## 2013-03-12 MED ORDER — FUROSEMIDE 40 MG PO TABS
40.0000 mg | ORAL_TABLET | Freq: Two times a day (BID) | ORAL | Status: DC
  Administered 2013-03-12 – 2013-03-16 (×8): 40 mg via ORAL
  Filled 2013-03-12 (×10): qty 1

## 2013-03-12 MED ORDER — ALPRAZOLAM 0.25 MG PO TABS
0.2500 mg | ORAL_TABLET | Freq: Once | ORAL | Status: AC
  Administered 2013-03-12: 0.25 mg via ORAL

## 2013-03-12 NOTE — Progress Notes (Signed)
Pharmacist Heart Failure Core Measure Documentation  Assessment: Mata ARMANDINA IMAN has an EF documented as 35-40% on 03/08/13 by ECHO.  Rationale: Heart failure patients with left ventricular systolic dysfunction (LVSD) and an EF < 40% should be prescribed an angiotensin converting enzyme inhibitor (ACEI) or angiotensin receptor blocker (ARB) at discharge unless a contraindication is documented in the medical record.  This patient is not currently on an ACEI or ARB for HF.  This note is being placed in the record in order to provide documentation that a contraindication to the use of these agents is present for this encounter.  ACE Inhibitor or Angiotensin Receptor Blocker is contraindicated (specify all that apply)  []   ACEI allergy AND ARB allergy []   Angioedema []   Moderate or severe aortic stenosis []   Hyperkalemia [x]   Hypotension []   Renal artery stenosis [x]   Worsening renal function, preexisting renal disease or dysfunction  Tad Moore, BCPS  Clinical Pharmacist Pager [phone]  03/12/2013 9:15 AM

## 2013-03-12 NOTE — Clinical Documentation Improvement (Signed)
CHF DOCUMENTATION CLARIFICATION QUERY THIS DOCUMENT IS NOT A PERMANENT PART OF THE MEDICAL RECORD Please update your documentation within the medical record to reflect your response to this query.                                                                                    03/12/13 Dear Associates,  In a better effort to capture your patient's severity of illness, reflect appropriate length of stay and utilization of resources, a review of the patient medical record has revealed the following indicators the diagnosis of Heart Failure.   Based on your clinical judgment, please clarify and document in a progress note and/or discharge summary the clinical condition associated with the following supporting information: In responding to this query please exercise your independent judgment.  The fact that a query is asked, does not imply that any particular answer is desired or expected.  Possible Clinical Conditions?  Acute Systolic Congestive Heart Failure Acute Systolic & Diastolic Congestive Heart Failure Acute on Chronic Systolic Congestive Heart Failure Acute on Chronic Systolic & Diastolic  Congestive Heart Failure Other Condition Cannot Clinically Determine  Supporting Information:  Risk Factors:History: PMH: Coronary artery disease. Congestive heart failure. Stroke. Risk factors: Hypertension. Diabetes mellitus.  Signs & Symptoms: increasing shortness of breath x 1 day and progressively worsening bilateral lower extremity edema x 4 days; BNP was 15,800  Diagnostics:Left ventricle: The cavity size was normal. Wall thickness was increased in a pattern of mild LVH. Systolic function was moderately reduced. The estimated ejection fraction was in the range of 35% to 40%. Severe hypokinesis of the  anteroseptal and apical myocardium. Moderate hypokinesis of the inferior myocardium. The study is not technically sufficient to allow evaluation of LV diastolic function. - Mitral valve:  Mild regurgitation.  - Left atrium: The atrium was mildly &dilated. - Right ventricle: The cavity size was moderately &dilated. Systolic function was mildly reduced. - Right atrium: The atrium was mildly &dilated. - Pulmonic valve: Moderate regurgitation.  - Pulmonary arteries: Systolic pressure was mildly increased. PA peak pressure: 41mm Hg (S). - Pericardium, extracardiac: There was a left pleural effusion.  Treatment: lasix   Reviewed: additional documentation in the medical record [facility]   Thank You,  Amada Kingfisher  RN, BSN, CCM Clinical Documentation Specialist: Health Information Management/[place] [phone] [email or web address]

## 2013-03-12 NOTE — Progress Notes (Signed)
ANTICOAGULATION CONSULT NOTE - Follow Up Consult  Pharmacy Consult for Heparin Indication: Afib  Allergies  Allergen Reactions  . Influenza Vaccines Swelling  . Demerol Nausea And Vomiting  . Promethazine Other (See Comments)    Pt. Unsure  . Zithromax (Azithromycin) Nausea And Vomiting  . Penicillins Rash    Patient Measurements: Height: 5\' 6"  (167.6 cm) Weight: 149 lb 11.1 oz (67.9 kg) IBW/kg (Calculated) : 59.3 Heparin Dosing Weight: 59.3kg  Vital Signs: Temp: 98.5 F (36.9 C) (06/09 0812) Temp src: Oral (06/09 0812) BP: 119/63 mmHg (06/09 0812) Pulse Rate: 77 (06/09 0700)  Labs:  Recent Labs  03/10/13 0420 03/11/13 0420 03/11/13 1545 03/12/13 0345  HGB 8.3* 8.6*  --  8.4*  HCT 27.0* 27.7*  --  27.5*  PLT 162 171  --  156  HEPARINUNFRC 0.65 0.72* 0.67 0.53  CREATININE 2.08* 2.11*  --  1.95*    Estimated Creatinine Clearance: 19.4 ml/min (by C-G formula based on Cr of 1.95).   Medications:  Heparin 800 units/hr  Assessment: 88 yof continues on heparin for afib. Heparin level therapeutic at 0.53.  No bleeding or complications notes.  Not considered to be a Coumadin candidate PTA.  Goal of Therapy:  Heparin level 0.3-0.7 units/ml Monitor platelets by anticoagulation protocol: Yes   Plan:  1. Continue heparin drip 800 units/hr (8 ml/hr) 2. Continue daily heparin level. 3. Follow-up plans for long-term anticoagulation?  Tad Moore, BCPS  Clinical Pharmacist Pager [phone]  03/12/2013 8:55 AM

## 2013-03-12 NOTE — Progress Notes (Signed)
[facility]  Subjective: Pt states that she is feeling much better today than yesterday. She is out of bed, in a chair and eating breakfast.  Objective: Vital signs in last 24 hours: Temp:  [98.1 F (36.7 C)-98.9 F (37.2 C)] 98.6 F (37 C) (06/09 0334) Pulse Rate:  [36-186] 77 (06/09 0700) Resp:  [14-24] 20 (06/09 0700) BP: (107-132)/(43-83) 111/43 mmHg (06/08 2227) SpO2:  [88 %-100 %] 92 % (06/09 0700) Weight:  [149 lb 11.1 oz (67.9 kg)] 149 lb 11.1 oz (67.9 kg) (06/09 0334) Last BM Date: 03/09/13  Intake/Output from previous day: 06/08 0701 - 06/09 0700 In: 920 [P.O.:480; I.V.:440] Out: 2250 [Urine:2250] Intake/Output this shift:    Medications Current Facility-Administered Medications  Medication Dose Route Frequency Provider Last Rate Last Dose  . 0.9 %  sodium chloride infusion  250 mL Intravenous PRN Brittainy Simmons, PA-C 10 mL/hr at 03/10/13 1834    . acetaminophen (TYLENOL) tablet 650 mg  650 mg Oral Q4H PRN Brittainy Simmons, PA-C      . ALPRAZolam Prudy Feeler) tablet 0.25 mg  0.25 mg Oral QHS PRN Leeann Must, MD      . aspirin EC tablet 81 mg  81 mg Oral Daily Brittainy Simmons, PA-C   81 mg at 03/11/13 1100  . atorvastatin (LIPITOR) tablet 40 mg  40 mg Oral Daily Brittainy Simmons, PA-C   40 mg at 03/11/13 1100  . calcium-vitamin D (OSCAL WITH D) 500-200 MG-UNIT per tablet 1 tablet  1 tablet Oral Daily Brittainy Simmons, PA-C   1 tablet at 03/11/13 1100  . carvedilol (COREG) tablet 9.375 mg  9.375 mg Oral BID WC Lennette Bihari, MD   9.375 mg at 03/11/13 1800  . digoxin (LANOXIN) tablet 0.0625 mg  0.0625 mg Oral Daily Lennette Bihari, MD   0.0625 mg at 03/11/13 1100  . furosemide (LASIX) tablet 20 mg  20 mg Oral Daily Lennette Bihari, MD   20 mg at 03/11/13 1100  . gabapentin (NEURONTIN) capsule 100 mg  100 mg Oral QHS Brittainy Simmons, PA-C   100 mg at 03/11/13 2114  . heparin ADULT infusion 100 units/mL (25000 units/250 mL)  800 Units/hr  Intravenous Continuous Lennette Bihari, MD 8 mL/hr at 03/11/13 0744 800 Units/hr at 03/11/13 0744  . insulin aspart (novoLOG) injection 0-9 Units  0-9 Units Subcutaneous TID WC Brittainy Simmons, PA-C   2 Units at 03/11/13 1839  . insulin glargine (LANTUS) injection 10 Units  10 Units Subcutaneous QHS Brittainy Simmons, PA-C   10 Units at 03/11/13 2116  . isosorbide mononitrate (IMDUR) 24 hr tablet 30 mg  30 mg Oral Daily Brittainy Simmons, PA-C   30 mg at 03/11/13 1100  . levothyroxine (SYNTHROID, LEVOTHROID) tablet 75 mcg  75 mcg Oral QAC breakfast Brittainy Simmons, PA-C   75 mcg at 03/11/13 0748  . nitroGLYCERIN (NITROSTAT) SL tablet 0.4 mg  0.4 mg Sublingual Q5 Min x 3 PRN Brittainy Simmons, PA-C      . ondansetron (ZOFRAN) injection 4 mg  4 mg Intravenous Q6H PRN Brittainy Simmons, PA-C   4 mg at 03/10/13 1755  . pantoprazole (PROTONIX) EC tablet 40 mg  40 mg Oral QHS Brittainy Simmons, PA-C   40 mg at 03/11/13 2114  . potassium chloride SA (K-DUR,KLOR-CON) CR tablet 20 mEq  20 mEq Oral Daily Brittainy Simmons, PA-C   20 mEq at 03/11/13 1100  . sodium chloride 0.9 % injection 3 mL  3 mL Intravenous Q12H  Brittainy Simmons, PA-C   3 mL at 03/11/13 1000  . sodium chloride 0.9 % injection 3 mL  3 mL Intravenous PRN Brittainy Simmons, PA-C      . zolpidem (AMBIEN) tablet 5 mg  5 mg Oral QHS PRN Brittainy Simmons, PA-C   5 mg at 03/10/13 2148    PE: General appearance: alert, cooperative and no distress Lungs: minimal crackles in the LLL Heart: irregularly irregular rhythm Extremities: trace LEE on the left Pulses: 2+ and symmetric Skin: warm and dry Neurologic: Grossly normal  Lab Results:   Recent Labs  03/10/13 0420 03/11/13 0420 03/12/13 0345  WBC 5.4 5.1 5.7  HGB 8.3* 8.6* 8.4*  HCT 27.0* 27.7* 27.5*  PLT 162 171 156   BMET  Recent Labs  03/10/13 0420 03/11/13 0420 03/12/13 0345  NA 137 138 138  K 4.1 4.7 4.6  CL 103 105 106  CO2 23 24 23   GLUCOSE 75 126* 117*  BUN  39* 35* 32*  CREATININE 2.08* 2.11* 1.95*  CALCIUM 8.9 8.7 8.9   BNP (last 3 results)  Recent Labs  03/08/13 1624 03/09/13 0415 03/11/13 0420  PROBNP 11058.0* 10430.0* 13321.0*    Assessment/Plan  Principal Problem:   CHF (congestive heart failure) Active Problems:   PAF. controlled ventricular response at time of admission   Hypertension   CABG 2000. PCI 2/10 to RI, cath 04/10/12- medical Rx. EF 50-55%   PVD, RFA PTA 2/12   Hyperlipemia   DM (diabetes mellitus)   CKD (chronic kidney disease), stage III   CAD (coronary artery disease), stable with cath 04/10/12  Plan: Diuresis has improved. She diuresed 1.13 L in past 24 hrs and 2.1 L since admission. BNP was checked yesterday and was actually up from admission level at 13,321. Apparently, the pt has only been receiving 20 mg PO Lasix for the past 2 days, despite written order for 40 mg. Will place on 40 mg PO BID. Will recheck BNP tomorrow.  Renal function is improved at 1.95 (2.11 yesterday). BP is stable. Electrolytes are WNL. She continues in A-fib. HR is controlled in the 90s.     LOS: 4 days    Brittainy M. Sharol Harness, PA-C 03/12/2013 8:14 AM  I have seen and examined the patient along with Brittainy M. Sharol Harness, PA-C.  I have reviewed the chart, notes and new data.  I agree with PA's note.  Key new complaints: feels better, still on oxygen though Key examination changes: JVP up 6-7 cm, mild edema only in left calf (saphenectomy site) Key new findings / data: BNP still high, renal function better, Hgb low but stable  PLAN: We are underdosing her diuretic - she is only receiving 20 mg daily. Increase furosemide to 40 mg BID.  Transfer to tele.  She lives alone - need to ensure she is well diuresed before discharge and may need home health to ease transition. Discussed daily weight monitoring, signs and symptoms of CHF, sodium restriction, etc.   Thurmon Fair, MD, [facility] [phone] 03/12/2013, 8:57 AM

## 2013-03-12 NOTE — Clinical Documentation Improvement (Signed)
RENAL FAILURE DOCUMENTATION CLARIFICATION QUERY THIS DOCUMENT IS NOT A PERMANENT PART OF THE MEDICAL RECORD Please update your documentation within the medical record to reflect your response to this query.                                                                                    03/12/13 Dear Associates  In a better effort to capture your patient's severity of illness, reflect appropriate length of stay and utilization of resources, a review of the patient medical record has revealed the following indicators. Based on your clinical judgment, please clarify and document in a progress note and/or discharge summary the clinical condition associated with the following supporting information: In responding to this query please exercise your independent judgment.  The fact that a query is asked, does not imply that any particular answer is desired or expected.  Possible Clinical Conditions?   Acute Renal Failure  Acute Kidney Injury  Acute Tubular Necrosis  CKD stage IV  Other Condition  Cannot Clinically Determine   Supporting Information:  Risk Factors: CKD  Lab: Current Creatinine Level (trend):  Current BUN Level (trend): Component     Latest Ref Rng 01/26/2011 01/27/2011 07/13/2011 07/14/2011 04/10/2012 04/11/2012 04/12/2012 04/14/2012 03/08/2013 03/09/2013 03/10/2013 03/11/2013 03/12/2013  BUN     6 - 23 mg/dL 19 14 20 23 22 20 22 22  42 42 39 35 32  GFR cal non Af Amer 40 35 33 28 38 35 35 29 20 18  20 20 22   Creatinine     0.50 - 1.10 mg/dL 6.21 (H) 3.08 (H) 6.57 (H) 1.60 (H) 1.26 (H) 1.34 1.35 1.56 2.14 2.26 2.08 2.11 1.95  Treatments:evaluate, monitor                  You may use possible, probable, or suspect with inpatient documentation. possible, probable, suspected diagnoses MUST be documented at the time of discharge  Reviewed: additional documentation in the medical record  Thank You,  Amada Kingfisher  RN, BSN, CCM Clinical Documentation Specialist: Health Information  Management/[place] [phone] [email or web address] TO RESPOND TO THE THIS QUERY, FOLLOW THE INSTRUCTIONS BELOW:  1. If needed, update documentation for the patient's encounter via the notes activity.  2. Access this query again and click edit on the In [employer].  3. After updating, or not, click F2 to complete all highlighted (required) fields concerning your review. Select "additional documentation in the medical record" OR "no additional documentation provided".  4. Click Sign note button.  5. The deficiency will fall out of your In Basket *Please let us know if you are not able to complete this workflow by phone or e-mail (listed below).

## 2013-03-13 ENCOUNTER — Inpatient Hospital Stay (HOSPITAL_COMMUNITY): Payer: Medicare Other

## 2013-03-13 DIAGNOSIS — I255 Ischemic cardiomyopathy: Secondary | ICD-10-CM | POA: Diagnosis present

## 2013-03-13 DIAGNOSIS — D649 Anemia, unspecified: Secondary | ICD-10-CM | POA: Diagnosis present

## 2013-03-13 LAB — BASIC METABOLIC PANEL
BUN: 25 mg/dL — ABNORMAL HIGH (ref 6–23)
CO2: 23 mEq/L (ref 19–32)
Calcium: 8.7 mg/dL (ref 8.4–10.5)
Chloride: 110 mEq/L (ref 96–112)
Creatinine, Ser: 1.63 mg/dL — ABNORMAL HIGH (ref 0.50–1.10)
GFR calc Af Amer: 32 mL/min — ABNORMAL LOW (ref >90)
GFR calc non Af Amer: 27 mL/min — ABNORMAL LOW (ref >90)
Glucose, Bld: 111 mg/dL — ABNORMAL HIGH (ref 70–99)
Potassium: 4.3 mEq/L (ref 3.5–5.1)
Sodium: 141 mEq/L (ref 135–145)

## 2013-03-13 LAB — PRO B NATRIURETIC PEPTIDE: Pro B Natriuretic peptide (BNP): 13909 pg/mL — ABNORMAL HIGH (ref 0–450)

## 2013-03-13 LAB — CBC
HCT: 27.2 % — ABNORMAL LOW (ref 36.0–46.0)
Hemoglobin: 8.2 g/dL — ABNORMAL LOW (ref 12.0–15.0)
MCHC: 30.1 g/dL (ref 30.0–36.0)
RBC: 3.15 MIL/uL — ABNORMAL LOW (ref 3.87–5.11)
WBC: 4.6 10*3/uL (ref 4.0–10.5)

## 2013-03-13 LAB — HEPARIN LEVEL (UNFRACTIONATED): Heparin Unfractionated: 0.54 IU/mL (ref 0.30–0.70)

## 2013-03-13 LAB — GLUCOSE, CAPILLARY
Glucose-Capillary: 94 mg/dL (ref 70–99)
Glucose-Capillary: 121 mg/dL — ABNORMAL HIGH (ref 70–99)
Glucose-Capillary: 143 mg/dL — ABNORMAL HIGH (ref 70–99)

## 2013-03-13 MED ORDER — TRAZODONE HCL 50 MG PO TABS
50.0000 mg | ORAL_TABLET | Freq: Every evening | ORAL | Status: AC | PRN
  Administered 2013-03-13: 50 mg via ORAL
  Filled 2013-03-13: qty 1

## 2013-03-13 MED ORDER — TRAZODONE HCL 50 MG PO TABS
50.0000 mg | ORAL_TABLET | Freq: Every evening | ORAL | Status: DC | PRN
Start: 2013-03-13 — End: 2013-03-19
  Filled 2013-03-13: qty 1

## 2013-03-13 MED ORDER — ENOXAPARIN SODIUM 30 MG/0.3ML ~~LOC~~ SOLN
30.0000 mg | SUBCUTANEOUS | Status: DC
  Administered 2013-03-13 – 2013-03-18 (×6): 30 mg via SUBCUTANEOUS
  Filled 2013-03-13 (×7): qty 0.3

## 2013-03-13 NOTE — Progress Notes (Signed)
ANTICOAGULATION CONSULT NOTE - Follow Up Consult  Pharmacy Consult for Heparin Indication: Afib  Allergies  Allergen Reactions  . Influenza Vaccines Swelling  . Demerol Nausea And Vomiting  . Promethazine Other (See Comments)    Pt. Unsure  . Zithromax (Azithromycin) Nausea And Vomiting  . Penicillins Rash    Patient Measurements: Height: 5\' 6"  (167.6 cm) Weight: 148 lb 9.4 oz (67.4 kg) IBW/kg (Calculated) : 59.3 Heparin Dosing Weight: 59.3kg  Vital Signs: Temp: 98.3 F (36.8 C) (06/10 0759) Temp src: Oral (06/10 0759) BP: 133/76 mmHg (06/10 0759) Pulse Rate: 82 (06/10 0400)  Labs:  Recent Labs  03/11/13 0420 03/11/13 1545 03/12/13 0345 03/13/13 0425  HGB 8.6*  --  8.4* 8.2*  HCT 27.7*  --  27.5* 27.2*  PLT 171  --  156 135*  HEPARINUNFRC 0.72* 0.67 0.53 0.54  CREATININE 2.11*  --  1.95* 1.63*    Estimated Creatinine Clearance: 23.2 ml/min (by C-G formula based on Cr of 1.63).   Medications:  Heparin 800 units/hr  Assessment: 88 yof on heparin for afib. Heparin level therapeutic at 0.54 this AM.  No bleeding or complications notes.  Not considered to be a Coumadin candidate PTA, so IV heparin was stopped.  Goal of Therapy:  Heparin level 0.3-0.7 units/ml Monitor platelets by anticoagulation protocol: Yes   Plan:  Pharmacy will sign off.  Thanks!  Tad Moore, BCPS  Clinical Pharmacist Pager [phone]  03/13/2013 10:24 AM

## 2013-03-13 NOTE — Progress Notes (Signed)
Subjective:  No SOB this am. Intubated, but can follow a conversation, follows commands.  Objective:  Vital Signs in the last 24 hours: Temp:  [97.7 F (36.5 C)-99 F (37.2 C)] 98.3 F (36.8 C) (06/10 0759) Pulse Rate:  [82-149] 82 (06/10 0400) Resp:  [14-25] 23 (06/10 0759) BP: (109-138)/(35-76) 133/76 mmHg (06/10 0759) SpO2:  [94 %-98 %] 96 % (06/10 0759) Weight:  [148 lb 9.4 oz (67.4 kg)] 148 lb 9.4 oz (67.4 kg) (06/10 0500)  Intake/Output from previous day:  Intake/Output Summary (Last 24 hours) at 03/13/13 0827 Last data filed at 03/13/13 0700  Gross per 24 hour  Intake    774 ml  Output   1225 ml  Net   -451 ml    Physical Exam: General appearance: alert, cooperative and no distress Lungs: fine basilar crackles bilat Heart: irregularly irregular rhythm Extrem: 1+ edema on Lt   Rate: 80  Rhythm: atrial fibrillation  Lab Results:  Recent Labs  03/12/13 0345 03/13/13 0425  WBC 5.7 4.6  HGB 8.4* 8.2*  PLT 156 135*    Recent Labs  03/12/13 0345 03/13/13 0425  NA 138 141  K 4.6 4.3  CL 106 110  CO2 23 23  GLUCOSE 117* 111*  BUN 32* 25*  CREATININE 1.95* 1.63*    Imaging: No results found.  Cardiac Studies: Echo- EF 35-40% 03/08/13  Assessment/Plan:   Principal Problem:   Acute systolic CHF (congestive heart failure) Active Problems:   PAF. she was in NSR 11/13, AF on admission 03/08/13   Acute on chronic renal insufficiency   CABG 2000. PCI 2/10 to RI, cath 04/10/12- medical Rx. EF 50-55%   CKD (chronic kidney disease), stage III   Anemia, heme negative. 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 PT consult (she [facility] alone in a senior center complex, uses a walker at home.) I doubt she is a Coumadin candidate  with her history of anemia, falls (twice recently), and unstable gait- uses a walker. Stop Heparin. Check Iron studies. Continue Lasix. Check CXR.  Corine Shelter PA-C Beeper [phone] 03/13/2013, 8:27 AM  I have seen and examined the patient along with Corine Shelter, PAC.  I have reviewed the chart, notes and new data.  I agree with PA's note.  Key new complaints: breathing easier Key examination changes: almost 6 lb lighter than on admission Key new findings / data: creatinine markedly improved, but proBNP still very high  PLAN: Agree with transfer to telemetry. Needs additional diuresis. I agree that she is not a good anticoagulation candidate, will use ASA only for prevention of cardiembolic events.  Thurmon Fair, MD, [facility] [phone] 03/13/2013, 8:52 AM

## 2013-03-14 DIAGNOSIS — D649 Anemia, unspecified: Secondary | ICD-10-CM

## 2013-03-14 LAB — CBC
HCT: 28.6 % — ABNORMAL LOW (ref 36.0–46.0)
Hemoglobin: 8.6 g/dL — ABNORMAL LOW (ref 12.0–15.0)
MCH: 26.3 pg (ref 26.0–34.0)
MCHC: 30.1 g/dL (ref 30.0–36.0)
MCV: 87.5 fL (ref 78.0–100.0)
Platelets: 126 10*3/uL — ABNORMAL LOW (ref 150–400)
RBC: 3.27 MIL/uL — ABNORMAL LOW (ref 3.87–5.11)
RDW: 18.5 % — ABNORMAL HIGH (ref 11.5–15.5)
WBC: 5.7 10*3/uL (ref 4.0–10.5)

## 2013-03-14 LAB — GLUCOSE, CAPILLARY
Glucose-Capillary: 92 mg/dL (ref 70–99)
Glucose-Capillary: 147 mg/dL — ABNORMAL HIGH (ref 70–99)

## 2013-03-14 LAB — IRON AND TIBC
Iron: 28 ug/dL — ABNORMAL LOW (ref 42–135)
Saturation Ratios: 8 % — ABNORMAL LOW (ref 20–55)
TIBC: 344 ug/dL (ref 250–470)
UIBC: 316 ug/dL (ref 125–400)

## 2013-03-14 LAB — BASIC METABOLIC PANEL
BUN: 23 mg/dL (ref 6–23)
CO2: 20 mEq/L (ref 19–32)
Calcium: 9.3 mg/dL (ref 8.4–10.5)
Chloride: 108 mEq/L (ref 96–112)
Creatinine, Ser: 1.43 mg/dL — ABNORMAL HIGH (ref 0.50–1.10)
GFR calc Af Amer: 37 mL/min — ABNORMAL LOW (ref >90)
GFR calc non Af Amer: 32 mL/min — ABNORMAL LOW (ref >90)
Glucose, Bld: 93 mg/dL (ref 70–99)
Potassium: 4.7 mEq/L (ref 3.5–5.1)
Sodium: 139 mEq/L (ref 135–145)

## 2013-03-14 NOTE — Evaluation (Signed)
Physical Therapy Evaluation Patient Details Name: Catherine Clayton MRN: [id number] DOB: 1925/07/16 Today's Date: 03/14/2013 Time: 9562-1308 PT Time Calculation (min): 25 min  PT Assessment / Plan / Recommendation Clinical Impression  Pt is a very pleasant 79 yo female with acute systolic CHF. Pt was previously a patient at [facility] and was sent home with HHPT where she was modI with a RW. Pt reports she has a friend bring food to her. Pt currently is limited by fatigue and reports she feels "weak" following 100 ft of ambulation. Pt requires minG for transfers and ambulation safety.  Pt willl benefit from acute PT to improve mobility status and would require 24/7 supervision for OOB mobility and assistance with ADLs for d/c home. Pt would also benefit from HHPT to continue to improve pt's mobility status and return her to modI for ADLs. Discussed this d/c plan with pt and she confirms that her family will be available to stay with her and she is agreeable to HHPT.     PT Assessment  Patient needs continued PT services    Follow Up Recommendations  Home health PT;Supervision/Assistance - 24 hour;Supervision for mobility/OOB    Does the patient have the potential to tolerate intense rehabilitation      Barriers to Discharge        Equipment Recommendations       Recommendations for Other Services     Frequency Min 3X/week    Precautions / Restrictions Precautions Precautions: Fall Restrictions Weight Bearing Restrictions: No   Pertinent Vitals/Pain 0/10      Mobility  Bed Mobility Bed Mobility: Supine to Sit;Sitting - Scoot to Edge of Bed Supine to Sit: 6: Modified independent (Device/Increase time);HOB flat Sitting - Scoot to Edge of Bed: 5: Supervision Details for Bed Mobility Assistance: Pt requires increased time Transfers Transfers: Sit to Stand;Stand to Sit Sit to Stand: 4: Min guard;From bed;With upper extremity assist (v/c's for hand placement) Stand to Sit: 4: Min  guard;With upper extremity assist;To chair/3-in-1 Details for Transfer Assistance: Pt denies dizziness and demo's good balance when coming to standing Ambulation/Gait Ambulation/Gait Assistance: 4: Min guard Ambulation Distance (Feet): 100 Feet Assistive device: Rolling walker Ambulation/Gait Assistance Details: Pt reporting fatigue at 50 ft, asking to return to room Gait Pattern: Step-through pattern;Decreased stride length Gait velocity: decreased General Gait Details: Pt reports "I am weak" following ambulation Stairs: No    Exercises     PT Diagnosis: Difficulty walking  PT Problem List: Decreased activity tolerance;Decreased mobility;Cardiopulmonary status limiting activity PT Treatment Interventions: DME instruction;Gait training;Functional mobility training;Therapeutic activities;Therapeutic exercise;Balance training;Neuromuscular re-education   PT Goals Acute Rehab PT Goals PT Goal Formulation: With patient Time For Goal Achievement: 03/28/13 Potential to Achieve Goals: Good Pt will go Sit to Stand: with modified independence;with upper extremity assist PT Goal: Sit to Stand - Progress: Goal set today Pt will Transfer Bed to Chair/Chair to Bed: with supervision PT Transfer Goal: Bed to Chair/Chair to Bed - Progress: Goal set today Pt will Ambulate: 51 - 150 feet;with modified independence;with least restrictive assistive device PT Goal: Ambulate - Progress: Goal set today Pt will Perform Home Exercise Program: Independently PT Goal: Perform Home Exercise Program - Progress: Goal set today  Visit Information  Last PT Received On: 03/14/13 Assistance Needed: +1    Subjective Data  Subjective: Pt recieved in bed, agreeable to PT Patient Stated Goal: to go home   Prior Functioning  Home Living Lives With: Alone Available Help at Discharge: Family;Available 24 hours/day Type  of Home: House Home Access: Level entry Home Layout: One level Bathroom Shower/Tub: Health visitor Accessibility: Yes How Accessible: Accessible via walker Home Adaptive Equipment: Bedside commode/3-in-1;Shower chair with back;Straight cane;Walker - four wheeled Additional Comments: Pt recently d/c'ed from [employer], was able to get around home with RW. prior to previous admission was independent Prior Function Level of Independence: Independent with assistive device(s) Able to Take Stairs?: No Driving: No Vocation: Retired Comments: Pt with HHPT coming PTA Communication Communication: No difficulties    Cognition  Cognition Arousal/Alertness: Awake/alert Behavior During Therapy: WFL for tasks assessed/performed Overall Cognitive Status: Within Functional Limits for tasks assessed    Extremity/Trunk Assessment Right Upper Extremity Assessment RUE ROM/Strength/Tone: Within functional levels Left Upper Extremity Assessment LUE ROM/Strength/Tone: Within functional levels Right Lower Extremity Assessment RLE ROM/Strength/Tone: Within functional levels Left Lower Extremity Assessment LLE ROM/Strength/Tone: Within functional levels Trunk Assessment Trunk Assessment: Kyphotic   Balance Balance Balance Assessed: Yes Static Sitting Balance Static Sitting - Balance Support: No upper extremity supported Static Sitting - Level of Assistance: 7: Independent Static Sitting - Comment/# of Minutes: 2 Static Standing Balance Static Standing - Balance Support: Bilateral upper extremity supported Static Standing - Level of Assistance: 6: Modified independent (Device/Increase time) (RW) Static Standing - Comment/# of Minutes: 2  End of Session PT - End of Session Equipment Utilized During Treatment: Gait belt;Oxygen (3L O2 via [place]) Activity Tolerance: Patient limited by fatigue Patient left: in chair;with call bell/phone within reach Nurse Communication: Mobility status  GP     03/14/2013, 12:31 PM Marvis Moeller, Student Physical Therapist Office #:  [phone]   Agree with above assessment.  Lewis Shock, PT, DPT Pager #: [phone] Office #: [phone]

## 2013-03-14 NOTE — Progress Notes (Signed)
Subjective: "I walked in hall"  She had no SOB or chest pain.   Objective: Vital signs in last 24 hours: Temp:  [98.4 F (36.9 C)-98.7 F (37.1 C)] 98.4 F (36.9 C) (06/11 0455) Pulse Rate:  [81-95] 85 (06/11 1009) Resp:  [20] 20 (06/11 0455) BP: (119-132)/(50-78) 132/78 mmHg (06/11 0455) SpO2:  [97 %-98 %] 98 % (06/11 0455) Weight:  [146 lb 9.7 oz (66.5 kg)] 146 lb 9.7 oz (66.5 kg) (06/11 0455) Weight change: 7.1 oz (0.2 kg) Last BM Date: 03/13/13 Intake/Output from previous day: -1437 (-4198 total since admit)  Wt 146.9 down from 154.1 on admit 06/10 0701 - 06/11 0700 In: 696 [P.O.:660; I.V.:36] Out: 2151 [Urine:2150; Stool:1] Intake/Output this shift: Total I/O In: 120 [P.O.:120] Out: 301 [Urine:300; Stool:1]  PE: General:Pleasant affect, NAD Neck:supple, no JVD,sitting up in chair Heart:irreg irreg without murmur, gallup, rub or click Lungs:clear without rales, rhonchi, or wheezes ZOX:WRUE, non tender, + BS, do not palpate liver spleen or masses Ext:1+ lower ext edema, Neuro:alert and oriented, MAE, follows commands, + facial symmetry   Lab Results:  Recent Labs  03/13/13 0425 03/14/13 0415  WBC 4.6 5.7  HGB 8.2* 8.6*  HCT 27.2* 28.6*  PLT 135* 126*   BMET  Recent Labs  03/13/13 0425 03/14/13 0415  NA 141 139  K 4.3 4.7  CL 110 108  CO2 23 20  GLUCOSE 111* 93  BUN 25* 23  CREATININE 1.63* 1.43*  CALCIUM 8.7 9.3   No results found for this basename: TROPONINI, CK, MB,  in the last 72 hours  Lab Results  Component Value Date   CHOL 124 04/10/2012   HDL 47 04/10/2012   LDLCALC 64 04/10/2012   TRIG 65 04/10/2012   CHOLHDL 2.6 04/10/2012   Lab Results  Component Value Date   HGBA1C 7.0* 03/08/2013     Lab Results  Component Value Date   TSH 2.712 03/08/2013   BNP (last 3 results)  Recent Labs  03/09/13 0415 03/11/13 0420 03/13/13 0625  PROBNP 10430.0* 13321.0* 13909.0*      Studies/Results: Dg Chest 2 View  03/13/2013   *RADIOLOGY REPORT*   Clinical Data: Shortness of breath  CHEST - 2 VIEW  Comparison: March 08, 2013  Findings:  There is cardiomegaly with bilateral effusions and bibasilar edema consistent with congestive heart failure. Effusions and interstitial edema have increased since recent prior study. There is bibasilar atelectasis with questionable airspace consolidation in the bases as well.  The patient is status post coronary artery bypass grafting.  There is pulmonary venous hypertension.  No adenopathy.  The patient has had a previous kyphoplasty in the lower thoracic region.  IMPRESSION: Congestive heart failure, increased from recent prior study. There is airspace opacity in the lung bases.  Some of this opacity most likely represents atelectasis.  The remainder of the opacity either represents alveolar edema or possible superimposed pneumonia.   Original Report Authenticated By: Bretta Bang, M.D.    Medications: I have reviewed the patient's current medications. Scheduled Meds: . aspirin EC  81 mg Oral Daily  . atorvastatin  40 mg Oral Daily  . calcium-vitamin D  1 tablet Oral Daily  . carvedilol  9.375 mg Oral BID WC  . digoxin  0.0625 mg Oral Daily  . enoxaparin (LOVENOX) injection  30 mg Subcutaneous Q24H  . furosemide  40 mg Oral BID  . gabapentin  100 mg Oral QHS  . insulin aspart  0-9 Units Subcutaneous TID WC  .  insulin glargine  10 Units Subcutaneous QHS  . isosorbide mononitrate  30 mg Oral Daily  . levothyroxine  75 mcg Oral QAC breakfast  . pantoprazole  40 mg Oral QHS  . potassium chloride SA  20 mEq Oral Daily  . sodium chloride  3 mL Intravenous Q12H   Continuous Infusions:  PRN Meds:.sodium chloride, acetaminophen, ALPRAZolam, nitroGLYCERIN, ondansetron (ZOFRAN) IV, sodium chloride, traZODone, zolpidem  Assessment/Plan: Principal Problem:   Acute systolic CHF (congestive heart failure) Active Problems:   CABG 2000. PCI 2/10 to RI, cath 04/10/12- medical Rx. EF 50-55%   PAF. she was in NSR  11/13, AF on admission 03/08/13   DM (diabetes mellitus)   CKD (chronic kidney disease), stage III   Hypertension   PVD, RFA PTA 2/12   Hyperlipemia   LBBB (left bundle branch block)   CVA, Rt brain, 4/13, ASA stopped, Plavix continued   Acute on chronic renal insufficiency   Anemia, heme negative. This appears to be chronic anemia   Cardiomyopathy, ischemic, 35-40% EF by echo 03/08/13  PLAN: Iron is low , chronic anemia. On lasix 40 mg BID.   Still with some lower ext edema.   Glucose stable,  Will change oxygen to prn.  LOS: 6 days   Time spent with pt. :20 minutes. [facility] R  Nurse Practitioner Certified Pager [phone] 03/14/2013, 12:21 PM     Patient seen and examined. Agree with assessment and plan. I/O -4379 since admission. BNP remains elevated at 13909. Cr continues to improve.Able to walk today. AF rate controlled.   Lennette Bihari, MD, [facility] 03/14/2013 2:16 PM

## 2013-03-14 NOTE — Progress Notes (Signed)
Chart review complete.  Patient is not eligible for [facility] Care Management services because his/her PCP is not a [facility] primary care provider or is not [facility] affiliated.  For any additional questions or new referrals please contact Tim Henderson BSN RN [facility] Liaison at [phone] °

## 2013-03-14 NOTE — Evaluation (Signed)
Physical Therapy Evaluation Patient Details Name: Catherine Clayton MRN: [id number] DOB: [birth year] Today's Date: 03/14/2013 Time: 4098-1191 PT Time Calculation (min): 25 min  PT Assessment / Plan / Recommendation Clinical Impression  Pt is a very pleasant 79 yo female with acute systolic CHF. Pt was previously a patient at [facility] and was sent home with HHPT where she was modI with a RW. Pt reports she has a friend bring food to her. Pt currently is limited by fatigue and reports she feels "weak" following 100 ft of ambulation. Pt requires minG for transfers and ambulation safety.  Pt willl benefit from acute PT to improve mobility status and would require 24/7 supervision for OOB mobility and assistance with ADLs for d/c home. Pt would also benefit from HHPT to continue to improve pt's mobility status and return her to modI for ADLs. Discussed this d/c plan with pt and she confirms that her family will be available to stay with her and she is agreeable to HHPT.     PT Assessment  Patient needs continued PT services    Follow Up Recommendations  Home health PT;Supervision/Assistance - 24 hour;Supervision for mobility/OOB    Does the patient have the potential to tolerate intense rehabilitation      Barriers to Discharge        Equipment Recommendations       Recommendations for Other Services     Frequency Min 3X/week    Precautions / Restrictions Precautions Precautions: Fall Restrictions Weight Bearing Restrictions: No   Pertinent Vitals/Pain 0/10      Mobility  Bed Mobility Bed Mobility: Supine to Sit;Sitting - Scoot to Edge of Bed Supine to Sit: 6: Modified independent (Device/Increase time);HOB flat Sitting - Scoot to Edge of Bed: 5: Supervision Details for Bed Mobility Assistance: Pt requires increased time Transfers Transfers: Sit to Stand;Stand to Sit Sit to Stand: 4: Min guard;From bed;With upper extremity assist (v/c's for hand placement) Stand to Sit: 4: Min  guard;With upper extremity assist;To chair/3-in-1 Details for Transfer Assistance: Pt denies dizziness and demo's good balance when coming to standing Ambulation/Gait Ambulation/Gait Assistance: 4: Min guard Ambulation Distance (Feet): 100 Feet Assistive device: Rolling walker Ambulation/Gait Assistance Details: Pt reporting fatigue at 50 ft, asking to return to room Gait Pattern: Step-through pattern;Decreased stride length Gait velocity: decreased General Gait Details: Pt reports "I am weak" following ambulation Stairs: No    Exercises     PT Diagnosis: Difficulty walking  PT Problem List: Decreased activity tolerance;Decreased mobility;Cardiopulmonary status limiting activity PT Treatment Interventions: DME instruction;Gait training;Functional mobility training;Therapeutic activities;Therapeutic exercise;Balance training;Neuromuscular re-education   PT Goals Acute Rehab PT Goals PT Goal Formulation: With patient Time For Goal Achievement: 03/28/13 Potential to Achieve Goals: Good Pt will go Sit to Stand: with modified independence;with upper extremity assist PT Goal: Sit to Stand - Progress: Goal set today Pt will Transfer Bed to Chair/Chair to Bed: with supervision PT Transfer Goal: Bed to Chair/Chair to Bed - Progress: Goal set today Pt will Ambulate: 51 - 150 feet;with modified independence;with least restrictive assistive device PT Goal: Ambulate - Progress: Goal set today Pt will Perform Home Exercise Program: Independently PT Goal: Perform Home Exercise Program - Progress: Goal set today  Visit Information  Last PT Received On: 03/14/13 Assistance Needed: +1    Subjective Data  Subjective: Pt recieved in bed, agreeable to PT Patient Stated Goal: to go home   Prior Functioning  Home Living Lives With: Alone Available Help at Discharge: Family;Available 24 hours/day Type  of Home: House Home Access: Level entry Home Layout: One level Bathroom Shower/Tub: Health visitor Accessibility: Yes How Accessible: Accessible via walker Home Adaptive Equipment: Bedside commode/3-in-1;Shower chair with back;Straight cane;Walker - four wheeled Additional Comments: Pt recently d/c'ed from [employer], was able to get around home with RW. prior to previous admission was independent Prior Function Level of Independence: Independent with assistive device(s) Able to Take Stairs?: No Driving: No Vocation: Retired Comments: Pt with HHPT coming PTA Communication Communication: No difficulties    Cognition  Cognition Arousal/Alertness: Awake/alert Behavior During Therapy: WFL for tasks assessed/performed Overall Cognitive Status: Within Functional Limits for tasks assessed    Extremity/Trunk Assessment Right Upper Extremity Assessment RUE ROM/Strength/Tone: Within functional levels Left Upper Extremity Assessment LUE ROM/Strength/Tone: Within functional levels Right Lower Extremity Assessment RLE ROM/Strength/Tone: Within functional levels Left Lower Extremity Assessment LLE ROM/Strength/Tone: Within functional levels Trunk Assessment Trunk Assessment: Kyphotic   Balance Balance Balance Assessed: Yes Static Sitting Balance Static Sitting - Balance Support: No upper extremity supported Static Sitting - Level of Assistance: 7: Independent Static Sitting - Comment/# of Minutes: 2 Static Standing Balance Static Standing - Balance Support: Bilateral upper extremity supported Static Standing - Level of Assistance: 6: Modified independent (Device/Increase time) (RW) Static Standing - Comment/# of Minutes: 2  End of Session PT - End of Session Equipment Utilized During Treatment: Gait belt;Oxygen (3L O2 via [place]) Activity Tolerance: Patient limited by fatigue Patient left: in chair;with call bell/phone within reach Nurse Communication: Mobility status  GP     03/14/2013, 12:31 PM Marvis Moeller, Student Physical Therapist Office #: [phone]

## 2013-03-15 DIAGNOSIS — R609 Edema, unspecified: Secondary | ICD-10-CM

## 2013-03-15 LAB — BASIC METABOLIC PANEL
BUN: 21 mg/dL (ref 6–23)
CO2: 26 mEq/L (ref 19–32)
Calcium: 9.3 mg/dL (ref 8.4–10.5)
Chloride: 106 mEq/L (ref 96–112)
Creatinine, Ser: 1.51 mg/dL — ABNORMAL HIGH (ref 0.50–1.10)
GFR calc Af Amer: 35 mL/min — ABNORMAL LOW (ref >90)
GFR calc non Af Amer: 30 mL/min — ABNORMAL LOW (ref >90)
Glucose, Bld: 101 mg/dL — ABNORMAL HIGH (ref 70–99)
Potassium: 3.7 mEq/L (ref 3.5–5.1)
Sodium: 140 mEq/L (ref 135–145)

## 2013-03-15 LAB — GLUCOSE, CAPILLARY
Glucose-Capillary: 130 mg/dL — ABNORMAL HIGH (ref 70–99)
Glucose-Capillary: 162 mg/dL — ABNORMAL HIGH (ref 70–99)
Glucose-Capillary: 150 mg/dL — ABNORMAL HIGH (ref 70–99)

## 2013-03-15 LAB — VITAMIN B12: Vitamin B-12: 202 pg/mL — ABNORMAL LOW (ref 211–911)

## 2013-03-15 LAB — FERRITIN: Ferritin: 28 ng/mL (ref 10–291)

## 2013-03-15 NOTE — Progress Notes (Signed)
[facility]  Subjective: Breathing better. She has been ambulating with PT. She denies DOE and chest pain, but reports feeling a bit weak. No other complaints.   Objective: Vital signs in last 24 hours: Temp:  [98 F (36.7 C)-98.5 F (36.9 C)] 98 F (36.7 C) (06/12 0527) Pulse Rate:  [74-86] 80 (06/12 0948) Resp:  [19-20] 19 (06/12 0527) BP: (105-147)/(59-86) 147/86 mmHg (06/12 0814) SpO2:  [97 %-98 %] 97 % (06/12 0814) Weight:  [145 lb 3.2 oz (65.862 kg)] 145 lb 3.2 oz (65.862 kg) (06/12 0527) Last BM Date: 03/14/13  Intake/Output from previous day: 06/11 0701 - 06/12 0700 In: 600 [P.O.:600] Out: 2327 [Urine:2325; Stool:2] Intake/Output this shift: Total I/O In: 340 [P.O.:340] Out: -   Medications Current Facility-Administered Medications  Medication Dose Route Frequency Provider Last Rate Last Dose  . 0.9 %  sodium chloride infusion  250 mL Intravenous PRN Brittainy Simmons, PA-C 10 mL/hr at 03/13/13 0100 500 mL at 03/13/13 0100  . acetaminophen (TYLENOL) tablet 650 mg  650 mg Oral Q4H PRN Brittainy Simmons, PA-C      . ALPRAZolam Prudy Feeler) tablet 0.25 mg  0.25 mg Oral QHS PRN Leeann Must, MD   0.25 mg at 03/12/13 2108  . aspirin EC tablet 81 mg  81 mg Oral Daily Brittainy Simmons, PA-C   81 mg at 03/15/13 0947  . atorvastatin (LIPITOR) tablet 40 mg  40 mg Oral Daily Brittainy Simmons, PA-C   40 mg at 03/15/13 0947  . calcium-vitamin D (OSCAL WITH D) 500-200 MG-UNIT per tablet 1 tablet  1 tablet Oral Daily Brittainy Simmons, PA-C   1 tablet at 03/15/13 0947  . carvedilol (COREG) tablet 9.375 mg  9.375 mg Oral BID WC Lennette Bihari, MD   9.375 mg at 03/15/13 0811  . digoxin (LANOXIN) tablet 0.0625 mg  0.0625 mg Oral Daily Lennette Bihari, MD   0.0625 mg at 03/15/13 0948  . enoxaparin (LOVENOX) injection 30 mg  30 mg Subcutaneous Q24H Lennette Bihari, MD   30 mg at 03/14/13 1648  . furosemide (LASIX) tablet 40 mg  40 mg Oral BID Brittainy Simmons,  PA-C   40 mg at 03/15/13 0811  . gabapentin (NEURONTIN) capsule 100 mg  100 mg Oral QHS Brittainy Simmons, PA-C   100 mg at 03/14/13 2159  . insulin aspart (novoLOG) injection 0-9 Units  0-9 Units Subcutaneous TID WC Brittainy Simmons, PA-C   2 Units at 03/14/13 1647  . insulin glargine (LANTUS) injection 10 Units  10 Units Subcutaneous QHS Brittainy Simmons, PA-C   10 Units at 03/14/13 2201  . isosorbide mononitrate (IMDUR) 24 hr tablet 30 mg  30 mg Oral Daily Brittainy Simmons, PA-C   30 mg at 03/15/13 0948  . levothyroxine (SYNTHROID, LEVOTHROID) tablet 75 mcg  75 mcg Oral QAC breakfast Brittainy Simmons, PA-C   75 mcg at 03/15/13 0811  . nitroGLYCERIN (NITROSTAT) SL tablet 0.4 mg  0.4 mg Sublingual Q5 Min x 3 PRN Brittainy Simmons, PA-C      . ondansetron (ZOFRAN) injection 4 mg  4 mg Intravenous Q6H PRN Brittainy Simmons, PA-C   4 mg at 03/10/13 1755  . pantoprazole (PROTONIX) EC tablet 40 mg  40 mg Oral QHS Brittainy Simmons, PA-C   40 mg at 03/14/13 2159  . potassium chloride SA (K-DUR,KLOR-CON) CR tablet 20 mEq  20 mEq Oral Daily Brittainy Simmons, PA-C   20 mEq at 03/15/13 0948  . sodium chloride 0.9 % injection  3 mL  3 mL Intravenous Q12H Brittainy Simmons, PA-C   3 mL at 03/15/13 0949  . sodium chloride 0.9 % injection 3 mL  3 mL Intravenous PRN Brittainy Simmons, PA-C      . traZODone (DESYREL) tablet 50 mg  50 mg Oral QHS PRN Quintella Reichert, MD      . zolpidem (AMBIEN) tablet 5 mg  5 mg Oral QHS PRN Robbie Lis, PA-C   5 mg at 03/14/13 2232    PE: General appearance: alert, cooperative and no distress Lungs: clear to auscultation bilaterally Heart: irregularly irregular rhythm Extremities: trace LEE on the right,1+on the left Pulses: 2+ and symmetric Skin: warm and dry Neurologic: Grossly normal  Lab Results:   Recent Labs  03/13/13 0425 03/14/13 0415  WBC 4.6 5.7  HGB 8.2* 8.6*  HCT 27.2* 28.6*  PLT 135* 126*   BMET  Recent Labs  03/13/13 0425  03/14/13 0415 03/15/13 0538  NA 141 139 140  K 4.3 4.7 3.7  CL 110 108 106  CO2 23 20 26   GLUCOSE 111* 93 101*  BUN 25* 23 21  CREATININE 1.63* 1.43* 1.51*  CALCIUM 8.7 9.3 9.3   BNP (last 3 results)  Recent Labs  03/09/13 0415 03/11/13 0420 03/13/13 0625  PROBNP 10430.0* 13321.0* 13909.0*   Filed Weights   03/13/13 1218 03/14/13 0455 03/15/13 0527  Weight: 149 lb 0.5 oz (67.6 kg) 146 lb 9.7 oz (66.5 kg) 145 lb 3.2 oz (65.862 kg)   Assessment/Plan  Principal Problem:   Acute systolic CHF (congestive heart failure) Active Problems:   PAF. she was in NSR 11/13, AF on admission 03/08/13   Hypertension   CABG 2000. PCI 2/10 to RI, cath 04/10/12- medical Rx. 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 This appears to be chronic anemia   Cardiomyopathy, ischemic, 35-40% EF by echo 03/08/13  Plan: Good diuresis yesterday. -1.7 L in past 24 hrs and 5.58 L since admission.   Admission weight was 154 lbs. Weight today is 145. BNP 2 days ago was still ~13K. Will recheck. ? Repeat CXR. SCr is 1.51. Continue with current Lasix dose of 40 mg BID.     LOS: 7 days    Brittainy M. Delmer Islam 03/15/2013 10:37 AM    Patient seen and examined. Agree with assessment and plan.  I/O -5585 since admission; wt 154 to 145. Able to walk, but still weak. I spoke with son yesterday. Still with LE edema. Continue diuresis. May need assistance at home once dc, but probably can go home rather than assisted living.   Lennette Bihari, MD, [facility] 03/15/2013 11:05 AM

## 2013-03-15 NOTE — Progress Notes (Signed)
Physical Therapy Treatment Patient Details Name: Catherine Clayton MRN: [id number] DOB: 1925/09/27 Today's Date: 03/15/2013 Time: 1478-2956 PT Time Calculation (min): 14 min  PT Assessment / Plan / Recommendation Comments on Treatment Session  Pt is a pleasant 79 yo female with CHF. Pt more talkative today and agreeable to ambulation but ambulated the same distance as yesterday. Pt requiring increased assistance with sit to stand from chair. Continue to recommend 24/7 assistance for OOB mobility and ADLs at home. Recommend HHPT to furhter progress activity tolerance and mobility.    Follow Up Recommendations  Home health PT;Supervision/Assistance - 24 hour;Supervision for mobility/OOB     Does the patient have the potential to tolerate intense rehabilitation     Barriers to Discharge        Equipment Recommendations       Recommendations for Other Services    Frequency Min 3X/week   Plan Discharge plan remains appropriate    Precautions / Restrictions Precautions Precautions: Fall Restrictions Weight Bearing Restrictions: No   Pertinent Vitals/Pain 0/10    Mobility  Transfers Transfers: Sit to Stand;Stand to Sit Sit to Stand: With upper extremity assist;4: Min assist;From chair/3-in-1 (v/c's for hand placement) Stand to Sit: 4: Min guard;With upper extremity assist;To chair/3-in-1 Details for Transfer Assistance: Pt requiring more assistance to sit to stand from chair than from bed yesterday Ambulation/Gait Ambulation/Gait Assistance: 4: Min guard Ambulation Distance (Feet): 100 Feet Assistive device: Rolling walker Ambulation/Gait Assistance Details: Pt reporting fatigue, asking for chair Gait Pattern: Step-through pattern;Decreased stride length Gait velocity: decreased General Gait Details: Pt reports "I wish I had done better" Stairs: No    Exercises     PT Diagnosis:    PT Problem List:   PT Treatment Interventions:     PT Goals Acute Rehab PT Goals PT Goal  Formulation: With patient Time For Goal Achievement: 03/28/13 Potential to Achieve Goals: Good PT Goal: Sit to Stand - Progress: Progressing toward goal PT Transfer Goal: Bed to Chair/Chair to Bed - Progress: Progressing toward goal PT Goal: Ambulate - Progress: Progressing toward goal  Visit Information  Last PT Received On: 03/15/13 Assistance Needed: +1    Subjective Data  Subjective: Pt recieved in chair, agreeable to PT, says she is feeling better Patient Stated Goal: to go home   Cognition  Cognition Arousal/Alertness: Awake/alert Behavior During Therapy: WFL for tasks assessed/performed Overall Cognitive Status: Within Functional Limits for tasks assessed    Balance  Static Standing Balance Static Standing - Level of Assistance:  (RW)  End of Session PT - End of Session Equipment Utilized During Treatment: Gait belt;Oxygen (3L O2 via [place]) Activity Tolerance: Patient limited by fatigue Patient left: in chair;with call bell/phone within reach   GP     03/15/2013, 11:03 AM Marvis Moeller, Student Physical Therapist Office #: [phone]

## 2013-03-16 ENCOUNTER — Other Ambulatory Visit: Payer: Self-pay | Admitting: Cardiovascular Disease

## 2013-03-16 LAB — BASIC METABOLIC PANEL
BUN: 21 mg/dL (ref 6–23)
CO2: 28 mEq/L (ref 19–32)
Calcium: 9 mg/dL (ref 8.4–10.5)
Chloride: 104 mEq/L (ref 96–112)
Creatinine, Ser: 1.56 mg/dL — ABNORMAL HIGH (ref 0.50–1.10)
GFR calc Af Amer: 34 mL/min — ABNORMAL LOW (ref >90)
GFR calc non Af Amer: 29 mL/min — ABNORMAL LOW (ref >90)
Glucose, Bld: 110 mg/dL — ABNORMAL HIGH (ref 70–99)
Potassium: 3.5 mEq/L (ref 3.5–5.1)
Sodium: 141 mEq/L (ref 135–145)

## 2013-03-16 LAB — GLUCOSE, CAPILLARY
Glucose-Capillary: 131 mg/dL — ABNORMAL HIGH (ref 70–99)
Glucose-Capillary: 170 mg/dL — ABNORMAL HIGH (ref 70–99)

## 2013-03-16 LAB — PRO B NATRIURETIC PEPTIDE: Pro B Natriuretic peptide (BNP): 12397 pg/mL — ABNORMAL HIGH (ref 0–450)

## 2013-03-16 MED ORDER — FUROSEMIDE 40 MG PO TABS
40.0000 mg | ORAL_TABLET | Freq: Two times a day (BID) | ORAL | Status: DC
  Administered 2013-03-17 – 2013-03-19 (×5): 40 mg via ORAL
  Filled 2013-03-16 (×7): qty 1

## 2013-03-16 MED ORDER — FUROSEMIDE 10 MG/ML IJ SOLN
80.0000 mg | Freq: Once | INTRAMUSCULAR | Status: AC
  Administered 2013-03-16: 80 mg via INTRAVENOUS
  Filled 2013-03-16: qty 8

## 2013-03-16 NOTE — Progress Notes (Addendum)
Subjective:  Up in chair, comfortable, denies SOB  Objective:  Vital Signs in the last 24 hours: Temp:  [98.7 F (37.1 C)-99.2 F (37.3 C)] 99.2 F (37.3 C) (06/13 0610) Pulse Rate:  [61-83] 66 (06/13 1019) Resp:  [18-20] 18 (06/13 0610) BP: (115-131)/(50-80) 115/50 mmHg (06/13 1019) SpO2:  [94 %-99 %] 95 % (06/13 0610) Weight:  [64.1 kg (141 lb 5 oz)] 64.1 kg (141 lb 5 oz) (06/13 0610)  Intake/Output from previous day:  Intake/Output Summary (Last 24 hours) at 03/16/13 1132 Last data filed at 03/16/13 0900  Gross per 24 hour  Intake    900 ml  Output   1401 ml  Net   -501 ml    Physical Exam: General appearance: alert, cooperative and no distress no JVD Lungs: crackles Rt base Heart: irregularly irregular rhythm ABD; Soft Extrem: trace -1+ edema   Rate: 70  Rhythm: atrial fibrillation  Lab Results:  Recent Labs  03/14/13 0415  WBC 5.7  HGB 8.6*  PLT 126*    Recent Labs  03/15/13 0538 03/16/13 0420  NA 140 141  K 3.7 3.5  CL 106 104  CO2 26 28  GLUCOSE 101* 110*  BUN 21 21  CREATININE 1.51* 1.56*    Imaging: Imaging results have been reviewed  Cardiac Studies:  Assessment/Plan:   Principal Problem:   Acute systolic CHF (congestive heart failure) Active Problems:   PAF. she was in NSR 11/13, AF on admission 03/08/13   CABG 2000. PCI 2/10 to RI, cath 04/10/12- medical Rx. EF 50-55%   CKD (chronic kidney disease), stage III   Acute on chronic renal insufficiency   Anemia, heme negative. This appears to be chronic anemia   Cardiomyopathy, ischemic, 35-40% EF by echo 03/08/13   Hypertension   PVD, RFA PTA 2/12   Hyperlipemia   CVA, Rt brain, 4/13, ASA stopped, Plavix continued   LBBB (left bundle branch block)   Bradycardia, resoved off coreg   DM (diabetes mellitus)    PLAN: I/O last 24hrs shows no significant diuresis but her wgt continues to come down. Her BNP is still elevated - 12397 today. I'm not sure she is ready for discharge home yet.  Check CXR today. Give Lasix IV 80mg  this pm X 1.   Corine Shelter PA-C Bee[facility] [phone] 03/16/2013, 11:32 AM    Patient seen and examined. Agree with assessment and plan.Weight today decreased to 141. Continues to make progress. Able to walk to nursing station and back today. ? Accuracy of I/O with significant weight loss since admission. CXR and iv lasix today as above. Will recheck BNP in am ? Dc next 24 - 48 hrs.   Lennette Bihari, MD, [facility] 03/16/2013 12:35 PM

## 2013-03-16 NOTE — Telephone Encounter (Signed)
Sent refill

## 2013-03-16 NOTE — Progress Notes (Signed)
Physical Therapy Treatment Patient Details Name: Catherine Clayton MRN: [id number] DOB: May 01, 1925 Today's Date: 03/16/2013 Time: 7829-5621 PT Time Calculation (min): 23 min  PT Assessment / Plan / Recommendation Comments on Treatment Session  Pt able to ambulate ~150' today with x1 standing rest break to complete.  Educated on HEP.      Follow Up Recommendations  Home health PT;Supervision/Assistance - 24 hour;Supervision for mobility/OOB     Does the patient have the potential to tolerate intense rehabilitation     Barriers to Discharge        Equipment Recommendations       Recommendations for Other Services    Frequency Min 3X/week   Plan Discharge plan remains appropriate    Precautions / Restrictions Precautions Precautions: Fall Restrictions Weight Bearing Restrictions: No       Mobility  Bed Mobility Bed Mobility: Not assessed Transfers Transfers: Sit to Stand;Stand to Sit Sit to Stand: 4: Min assist;With upper extremity assist;With armrests;From chair/3-in-1 Stand to Sit: 4: Min guard;With upper extremity assist;With armrests;To chair/3-in-1 Details for Transfer Assistance: Performed 4x's.  Slow to achieve complete standing position Ambulation/Gait Ambulation/Gait Assistance: 4: Min guard Ambulation Distance (Feet): 150 Feet Assistive device: Rolling walker Ambulation/Gait Assistance Details: x 1 standing rest break.  Cues to encourage increased upright posture, & cues for pursed lip breathing.   Gait Pattern: Step-through pattern;Decreased stride length;Trunk flexed (decreased floor clearance) Gait velocity: decreased Stairs: No Wheelchair Mobility Wheelchair Mobility: No    Exercises General Exercises - Lower Extremity Ankle Circles/Pumps: AROM;Both;10 reps Long Arc Quad: AROM;Strengthening;Both;10 reps Hip ABduction/ADduction: AAROM;Strengthening;Both;10 reps;Seated Hip Flexion/Marching: AROM;Strengthening;Both;10 reps Toe Raises: AROM;Both;10  reps Heel Raises: AROM;Both;10 reps     PT Goals Acute Rehab PT Goals Time For Goal Achievement: 03/28/13 Potential to Achieve Goals: Good Pt will go Sit to Stand: with modified independence;with upper extremity assist PT Goal: Sit to Stand - Progress: Progressing toward goal Pt will Transfer Bed to Chair/Chair to Bed: with supervision Pt will Ambulate: 51 - 150 feet;with modified independence;with least restrictive assistive device PT Goal: Ambulate - Progress: Progressing toward goal Pt will Perform Home Exercise Program: Independently PT Goal: Perform Home Exercise Program - Progress: Progressing toward goal  Visit Information  Last PT Received On: 03/16/13 Assistance Needed: +1    Subjective Data      Cognition  Cognition Arousal/Alertness: Awake/alert Behavior During Therapy: WFL for tasks assessed/performed Overall Cognitive Status: Within Functional Limits for tasks assessed    Balance     End of Session PT - End of Session Equipment Utilized During Treatment: Gait belt;Oxygen (3L 02) Activity Tolerance: Patient tolerated treatment well Patient left: in chair;with call bell/phone within reach Nurse Communication: Mobility status     Verdell Face, [place] [phone] 03/16/2013

## 2013-03-16 NOTE — Progress Notes (Signed)
Quamir Willemsen, PTA [phone] 03/16/2013  

## 2013-03-17 ENCOUNTER — Inpatient Hospital Stay (HOSPITAL_COMMUNITY): Payer: Medicare Other

## 2013-03-17 DIAGNOSIS — I10 Essential (primary) hypertension: Secondary | ICD-10-CM

## 2013-03-17 DIAGNOSIS — E119 Type 2 diabetes mellitus without complications: Secondary | ICD-10-CM

## 2013-03-17 DIAGNOSIS — I2589 Other forms of chronic ischemic heart disease: Secondary | ICD-10-CM

## 2013-03-17 LAB — BASIC METABOLIC PANEL
BUN: 21 mg/dL (ref 6–23)
CO2: 25 mEq/L (ref 19–32)
Calcium: 8.8 mg/dL (ref 8.4–10.5)
Chloride: 103 mEq/L (ref 96–112)
Creatinine, Ser: 1.46 mg/dL — ABNORMAL HIGH (ref 0.50–1.10)
GFR calc Af Amer: 36 mL/min — ABNORMAL LOW (ref >90)
GFR calc non Af Amer: 31 mL/min — ABNORMAL LOW (ref >90)
Glucose, Bld: 127 mg/dL — ABNORMAL HIGH (ref 70–99)
Potassium: 3.8 mEq/L (ref 3.5–5.1)
Sodium: 139 mEq/L (ref 135–145)

## 2013-03-17 LAB — GLUCOSE, CAPILLARY
Glucose-Capillary: 116 mg/dL — ABNORMAL HIGH (ref 70–99)
Glucose-Capillary: 146 mg/dL — ABNORMAL HIGH (ref 70–99)
Glucose-Capillary: 158 mg/dL — ABNORMAL HIGH (ref 70–99)
Glucose-Capillary: 132 mg/dL — ABNORMAL HIGH (ref 70–99)

## 2013-03-17 LAB — PRO B NATRIURETIC PEPTIDE: Pro B Natriuretic peptide (BNP): 10764 pg/mL — ABNORMAL HIGH (ref 0–450)

## 2013-03-17 NOTE — Progress Notes (Signed)
Pt sitting up in chair in no acute distress.Voices no complaints

## 2013-03-17 NOTE — Progress Notes (Signed)
Rounding given on pt, pt resting pleasantly no distress noticed. We'll continue with POC.

## 2013-03-17 NOTE — Progress Notes (Addendum)
Subjective:  Up in chair, comfortable, denies SOB  Objective:  Vital Signs in the last 24 hours: Temp:  [98 F (36.7 C)-98.8 F (37.1 C)] 98.8 F (37.1 C) (06/14 0510) Pulse Rate:  [66-72] 72 (06/13 2040) Resp:  [18-19] 18 (06/14 0510) BP: (103-120)/(26-62) 120/54 mmHg (06/14 0510) SpO2:  [96 %-100 %] 100 % (06/14 0510) Weight:  [140 lb 10.5 oz (63.8 kg)] 140 lb 10.5 oz (63.8 kg) (06/14 0510)  Intake/Output from previous day:  Intake/Output Summary (Last 24 hours) at 03/17/13 0924 Last data filed at 03/17/13 1610  Gross per 24 hour  Intake    770 ml  Output   1401 ml  Net   -631 ml   Physical Exam: General appearance: alert, cooperative, appears stated age, no distress and Very pleasant mood and affect Neck: no carotid bruit and no JVD Lungs: rales bibasilar and Otherwise CTAB., nonlabored. Heart: irregularly irregular rhythm, S1, S2 normal, no S3 or S4, no click and No obvious murmur , nondisplaced PMI Abdomen: soft, non-tender; bowel sounds normal; no masses,  no organomegaly Extremities: edema Trace to 1+ Pulses: 2+ and symmetric Neurologic: Grossly normal Significant thoracic kyphoscoliosis.   Rate: 70  Rhythm: atrial fibrillation  Lab Results: No results found for this basename: WBC, HGB, PLT,  in the last 72 hours  Recent Labs  03/16/13 0420 03/17/13 0530  NA 141 139  K 3.5 3.8  CL 104 103  CO2 28 25  GLUCOSE 110* 127*  BUN 21 21  CREATININE 1.56* 1.46*    Imaging: Imaging results have been reviewed  Cardiac Studies:  Assessment/Plan:   Principal Problem:   Acute systolic CHF (congestive heart failure) Active Problems:   CABG 2000. PCI 2/10 to RI, cath 04/10/12- medical Rx. EF 50-55%   Hypertension   PAF. she was in NSR 11/13, AF on admission 03/08/13   Hyperlipemia   CVA, Rt brain, 4/13, ASA stopped, Plavix continued   Bradycardia, resoved off coreg   DM (diabetes mellitus)   CKD (chronic kidney disease), stage III   PVD, RFA PTA 2/12   LBBB  (left bundle branch block)   Acute on chronic renal insufficiency   Anemia, heme negative. This appears to be chronic anemia   Cardiomyopathy, ischemic, 35-40% EF by echo 03/08/13   Weight today decreased to 140. Continues to make progress I/O last 24hrs shows a little better, diuresis but her wgt continues to come down - 1 lb (which is actually commensurate with net out fluids). Her BNP is still elevated, but down to  - 10760 today. CXR not done yesterday -- will check today.  Given Lasix IV 80mg  last PM & now back on PO 40 mg bid.    Plan:  With proBNP still > 1000, I think she still needs more diuresis.  Was on Lasix 40 mg PO daily at home & currently on 40 mg bid -- would need to see how she does one full day on what may be her new d/c dose.  Will need to educate re: Sliding Scale lasix & daily weights..  Check CXR today (not done yesterday)  BP & HR has remained stable on lower dose of Coreg & Digoxin.  Renal function is holding steady. Glycemic control is good on current regimen.  All things are pointing towards home soon -- as per Dr. Bishop Limbo - hopefully Monday, as it seems like she may be going to an assisted living facility. Will have social work/case manager cyst within the  stent.  Marykay Lex, M.D., M.S. [facility] [address] [place], [place]  [address]  [phone] Pager # [phone] 03/17/2013 9:31 AM

## 2013-03-18 DIAGNOSIS — N289 Disorder of kidney and ureter, unspecified: Secondary | ICD-10-CM

## 2013-03-18 DIAGNOSIS — N189 Chronic kidney disease, unspecified: Secondary | ICD-10-CM

## 2013-03-18 LAB — BASIC METABOLIC PANEL
BUN: 23 mg/dL (ref 6–23)
CO2: 28 mEq/L (ref 19–32)
Calcium: 9.4 mg/dL (ref 8.4–10.5)
Chloride: 104 mEq/L (ref 96–112)
Creatinine, Ser: 1.39 mg/dL — ABNORMAL HIGH (ref 0.50–1.10)
GFR calc Af Amer: 39 mL/min — ABNORMAL LOW (ref >90)
GFR calc non Af Amer: 33 mL/min — ABNORMAL LOW (ref >90)
Glucose, Bld: 149 mg/dL — ABNORMAL HIGH (ref 70–99)
Potassium: 3.4 mEq/L — ABNORMAL LOW (ref 3.5–5.1)
Sodium: 139 mEq/L (ref 135–145)

## 2013-03-18 LAB — GLUCOSE, CAPILLARY: Glucose-Capillary: 117 mg/dL — ABNORMAL HIGH (ref 70–99)

## 2013-03-18 NOTE — Clinical Social Work Psychosocial (Addendum)
    Clinical Social Work Department BRIEF PSYCHOSOCIAL ASSESSMENT 03/18/2013  Patient:  Catherine Clayton, Catherine Clayton     Account Number:  [id number]     Admit date:  03/08/2013  Clinical Social Worker:  Tiburcio Pea  Date/Time:  03/16/2013 06:50 PM  Referred by:  Physician  Date Referred:  03/15/2013 Referred for  SNF Placement   Other Referral:   Family wants ALF and have chosen a facility   Interview type:  Other - See comment Other interview type:   Patient, son Catherine Clayton and daughter in law    PSYCHOSOCIAL DATA Living Status:  ALONE Admitted from facility:   Level of care:   Primary support name:  Catherine Clayton ([phone] Primary support relationship to patient:  CHILD, ADULT Degree of support available:   Strong support    CURRENT CONCERNS Current Concerns  Other - See comment   Other Concerns:   Family wants ALF placement    SOCIAL WORK ASSESSMENT / PLAN CSW met with patient, her son and daughter in law this evening to talk about placement issues.  Patient was admitted from home where she has lived alone.  PT was recommended home health PT and dc plan was for patient to return home at d/c.  CSW was informed earlier today that an Admissions Rep from [facility] had visited and assessed patient this morning; she was contacted by patient's son Catherine Clayton to do so.   CSW called admissions director- Olivia Mackie and disccussed patient.  FL2 and reuested information sent to her after speaking with pateint for perimssion.  Fl2 placed on chart for MD's signature. Awating stability per MD;  facility will accept on Monday 03/20/13.   Assessment/plan status:  Psychosocial Support/Ongoing Assessment of Needs Other assessment/ plan:   Information/referral to community resources:   ALF list deferred as son has already chosen facility and arranged for admission    PATIENT'S/FAMILY'S RESPONSE TO PLAN OF CARE: Patient is a delightful 79 year old lady who is alert and oriented. She is  very invovled in current plans for continued care at the ALF but also seeks her son's advice and judgement.  Son is very invovled and supportive and he is pleased wtih d/c plan to ALF.  CSW  will asst with d/c when medically stable.

## 2013-03-18 NOTE — Progress Notes (Signed)
Patient ID: Catherine Clayton, female   DOB: [birth year], 79 y.o.   MRN: [id number] Subjective:  Up in chair, comfortable, denies SOB  Objective:  Vital Signs in the last 24 hours: Temp:  [98 F (36.7 C)-98.8 F (37.1 C)] 98 F (36.7 C) (06/15 0525) Pulse Rate:  [79-87] 79 (06/15 0525) Resp:  [17-18] 18 (06/15 0525) BP: (116-123)/(49-69) 116/59 mmHg (06/15 0525) SpO2:  [100 %] 100 % (06/15 0525) Weight:  [137 lb 12.6 oz (62.5 kg)] 137 lb 12.6 oz (62.5 kg) (06/15 0525)  Intake/Output from previous day:  Intake/Output Summary (Last 24 hours) at 03/18/13 0852 Last data filed at 03/18/13 0528  Gross per 24 hour  Intake    480 ml  Output   1275 ml  Net   -795 ml  Total net (-) 7682 ml  Physical Exam: General appearance: alert, cooperative, appears stated age, no distress and Very pleasant mood and affect Neck: no carotid bruit and no JVD Lungs: rales bibasilar and Otherwise CTAB., nonlabored. Heart: irregularly irregular rhythm, S1, S2 normal, no S3 or S4, no click and No obvious murmur , nondisplaced PMI Abdomen: soft, non-tender; bowel sounds normal; no masses,  no organomegaly Extremities: edema Trace to 1+ Pulses: 2+ and symmetric Neurologic: Grossly normal Significant thoracic kyphoscoliosis.   Rate: 70  Rhythm: atrial fibrillation, with aberrantly conducted beats / PVCs  Lab Results: No results found for this basename: WBC, HGB, PLT,  in the last 72 hours  Recent Labs  03/17/13 0530 03/18/13 0400  NA 139 139  K 3.8 3.4*  CL 103 104  CO2 25 28  GLUCOSE 127* 149*  BUN 21 23  CREATININE 1.46* 1.39*    Imaging: CXR 6/14: improving CHF findings; superimposed COPD.  Cardiac Studies:  Assessment/Plan:   Principal Problem:   Acute systolic CHF (congestive heart failure) Active Problems:   CABG 2000. PCI 2/10 to RI, cath 04/10/12- medical Rx. EF 50-55%   Hypertension   PAF. she was in NSR 11/13, AF on admission 03/08/13   Hyperlipemia   CVA, Rt brain, 4/13,  ASA stopped, Plavix continued   Bradycardia, resoved off coreg   DM (diabetes mellitus)   CKD (chronic kidney disease), stage III   PVD, RFA PTA 2/12   LBBB (left bundle branch block)   Acute on chronic renal insufficiency   Anemia, heme negative. This appears to be chronic anemia   Cardiomyopathy, ischemic, 35-40% EF by echo 03/08/13   Weight today decreased from 140 to 137. Continues to make progress I/O last 24hrs shows steady diuresis & wgt finally correlates. Her BNP is still elevated, but down to  - 10760 yesterday -- recheck in AM. CXR not done yesterday -- will check today.  Given Lasix IV 80mg  last PM & now back on PO 40 mg bid.    Plan:  Continue with 40 mg PO BID lasix for now as her renal function continues to improve & she still has signs of volume overload.  Was on Lasix 40 mg PO daily at home & currently on 40 mg bid -- would need to see how she does one full day on what may be her new d/c dose.  Will need to educate re: Sliding Scale lasix & daily weights..  CXR improved.  BP & HR has remained stable on lower dose of Coreg & Digoxin + Imdur.  Renal function is holding steady to improving. Glycemic control is good on current regimen.  All things are pointing towards  home soon -- as per Dr. Bishop Limbo - hopefully Monday, as it seems like she may be going to an assisted living facility. Will have social Designer, industrial/product assist with this effort.  Marykay Lex, M.D., M.S. [facility] [address] [place], [place]  [address]  [phone] Pager # [phone] 03/18/2013 8:52 AM

## 2013-03-18 NOTE — Progress Notes (Signed)
Rounding given, pt resting on bed comfortable, no distress noticed. We'll continue with POC.

## 2013-03-19 ENCOUNTER — Encounter (HOSPITAL_COMMUNITY): Payer: Self-pay | Admitting: Cardiology

## 2013-03-19 DIAGNOSIS — I4949 Other premature depolarization: Secondary | ICD-10-CM

## 2013-03-19 DIAGNOSIS — I493 Ventricular premature depolarization: Secondary | ICD-10-CM | POA: Diagnosis present

## 2013-03-19 LAB — BASIC METABOLIC PANEL
BUN: 23 mg/dL (ref 6–23)
CO2: 30 mEq/L (ref 19–32)
Calcium: 9.4 mg/dL (ref 8.4–10.5)
Chloride: 104 mEq/L (ref 96–112)
Creatinine, Ser: 1.44 mg/dL — ABNORMAL HIGH (ref 0.50–1.10)
GFR calc Af Amer: 37 mL/min — ABNORMAL LOW (ref >90)
GFR calc non Af Amer: 32 mL/min — ABNORMAL LOW (ref >90)
Glucose, Bld: 107 mg/dL — ABNORMAL HIGH (ref 70–99)
Potassium: 3.2 mEq/L — ABNORMAL LOW (ref 3.5–5.1)
Sodium: 142 mEq/L (ref 135–145)

## 2013-03-19 LAB — GLUCOSE, CAPILLARY
Glucose-Capillary: 114 mg/dL — ABNORMAL HIGH (ref 70–99)
Glucose-Capillary: 114 mg/dL — ABNORMAL HIGH (ref 70–99)
Glucose-Capillary: 244 mg/dL — ABNORMAL HIGH (ref 70–99)

## 2013-03-19 MED ORDER — INSULIN GLARGINE 100 UNIT/ML ~~LOC~~ SOLN: 10.0000 [IU] | Freq: Every day | SUBCUTANEOUS | Status: DC

## 2013-03-19 MED ORDER — CARVEDILOL 3.125 MG PO TABS: 9.3750 mg | ORAL_TABLET | Freq: Two times a day (BID) | ORAL | Status: DC

## 2013-03-19 MED ORDER — ACETAMINOPHEN 325 MG PO TABS: 650.0000 mg | ORAL_TABLET | ORAL | Status: AC | PRN

## 2013-03-19 MED ORDER — FUROSEMIDE 20 MG PO TABS: ORAL_TABLET | ORAL | Status: DC

## 2013-03-19 MED ORDER — FUROSEMIDE 40 MG PO TABS: 40.0000 mg | ORAL_TABLET | Freq: Every day | ORAL | Status: DC

## 2013-03-19 MED ORDER — DIGOXIN 62.5 MCG PO TABS: 0.0625 mg | ORAL_TABLET | Freq: Every day | ORAL | Status: DC

## 2013-03-19 NOTE — Discharge Summary (Signed)
Patient ID: Catherine Clayton,  MRN: [id number], DOB/AGE: [birth year] 77 y.o.  Admit date: 03/08/2013 Discharge date: 03/19/2013  Primary Care Provider:  Primary Cardiologist: Dr Allyson Sabal  Discharge Diagnoses:  Principal Problem:   Acute systolic CHF (congestive heart failure)  Active Problems:   PAF. (not felt to be a Coumadin candidate)   CABG 2000. PCI 2/10 to RI, cath 04/10/12- medical Rx.   CKD (chronic kidney disease), stage III   Acute on chronic renal insufficiency   Anemia, heme negative. 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 Allyson Sabal. She has a history of CAD, status post coronary artery bypass grafting in 2000. She has known PVOD, status post stenting of her left SFA (which by Dopplers appear occluded) as well as hypertension, hyperlipidemia, and  diabetes. She was last catheterized by Dr. Daphene Jaeger in the setting of a non-STEMI on April 10, 2012, revealing a patent vein graft to an RCA, patent LIMA to an occluded LAD, and a patent stent to a ramus branch with an EF of 50% to 55%. Plan was for medical Rx. She has PAF as well as PVCs.. She did have a stroke earlier in 2013 and it was decided to keep her on aspirin and Plavix as opposed to Coumadin, given her age.           The patient  presented to [facility], in [place], 03/08/13 increasing shortness of breath and progressively worsening bilateral lower extremity edema. Work-up at [facility] revealed the patient to be in CHF. BNP was 15,800 and her CXR was also suggestive of CHF. She was given 1 dose of IV Lasix. She was also  noted to be in atrial fibrillation with a controlled response. She was[facility] to [facility] for further evaluation and treatment. She was in atrial fibrillation with a HR in the 90s. Her systolic blood pressures were in the low 100s. The patient reported compliance with her daily medications. Her daughter-in-law states that she did eat a hotdog 4 days ago and eats saltine crackers with peanut butter as a daily snack.            Admission wgt was 69.9Kg, discharge wgt 62.1kg. Echo was done revealing an EF of 35-40%. She responded to diuresis. She was put on Heparin for AF but this was changed to DVT prophylaxis dose when it was decided she was not a Coumadin candidate. She was noted to be anemic but her stools were negative for blood. She had acute on chronic renal insufficiency with a SCr peak of 2.26 that improved back to her baseline of 1.5 at discharge. ACE was discontinued. We added beta blocker and gently increased the dose as she has had bradycardia in the past on Coreg. She and her family decided she should go to an assisted living situation after discharge and we supported them in this. We feel she can be discharged 03/19/13. She will need early TCM follow up.    Discharge Vitals:  Blood pressure 122/60, pulse 82, temperature 98.1 F (36.7 C), temperature source Oral,  resp. rate 20, height 5\' 6"  (1.676 m), weight 62.1 kg (136 lb 14.5 oz), SpO2 95.00%.    Labs: Results for orders placed during the hospital encounter of 03/08/13 (from the past 48 hour(s))  GLUCOSE, CAPILLARY     Status: Abnormal   Collection Time    03/17/13 11:00 AM      Result Value Range   Glucose-Capillary 146 (*) 70 - 99 mg/dL  GLUCOSE, CAPILLARY     Status: Abnormal   Collection Time    03/17/13  4:12 PM      Result Value Range   Glucose-Capillary 158 (*) 70 - 99 mg/dL  GLUCOSE, CAPILLARY     Status: Abnormal   Collection Time    03/17/13  9:22 PM      Result Value Range   Glucose-Capillary 132 (*) 70 - 99  mg/dL   Comment 1 Notify RN    BASIC METABOLIC PANEL     Status: Abnormal   Collection Time    03/18/13  4:00 AM      Result Value Range   Sodium 139  135 - 145 mEq/L   Potassium 3.4 (*) 3.5 - 5.1 mEq/L   Chloride 104  96 - 112 mEq/L   CO2 28  19 - 32 mEq/L   Glucose, Bld 149 (*) 70 - 99 mg/dL   BUN 23  6 - 23 mg/dL   Creatinine, Ser 1.61 (*) 0.50 - 1.10 mg/dL   Calcium 9.4  8.4 - 09.6 mg/dL   GFR calc non Af Amer 33 (*) >90 mL/min   GFR calc Af Amer 39 (*) >90 mL/min   Comment:            The eGFR has been calculated     using the CKD EPI equation.     This calculation has not been     validated in all clinical     situations.     eGFR's persistently     <90 mL/min signify     possible Chronic Kidney Disease.  GLUCOSE, CAPILLARY     Status: Abnormal   Collection Time    03/18/13  6:07 AM      Result Value Range   Glucose-Capillary 117 (*) 70 - 99 mg/dL  GLUCOSE, CAPILLARY     Status: Abnormal   Collection Time    03/18/13 10:58 AM      Result Value Range   Glucose-Capillary 214 (*) 70 - 99 mg/dL  GLUCOSE, CAPILLARY     Status: Abnormal   Collection Time    03/18/13  3:50 PM      Result Value Range   Glucose-Capillary 124 (*) 70 - 99 mg/dL  GLUCOSE, CAPILLARY     Status: Abnormal   Collection Time    03/18/13  9:01 PM      Result Value Range   Glucose-Capillary 163 (*) 70 - 99 mg/dL   Comment 1 Notify RN    BASIC METABOLIC PANEL     Status: Abnormal   Collection Time    03/19/13  5:05 AM      Result Value Range   Sodium 142  135 - 145 mEq/L   Potassium 3.2 (*) 3.5 - 5.1 mEq/L   Chloride 104  96 - 112 mEq/L   CO2 30  19 - 32 mEq/L   Glucose, Bld 107 (*) 70 - 99 mg/dL   BUN 23  6 - 23 mg/dL   Creatinine, Ser 0.45 (*) 0.50 - 1.10 mg/dL  Calcium 9.4  8.4 - 10.5 mg/dL   GFR calc non Af Amer 32 (*) >90 mL/min   GFR calc Af Amer 37 (*) >90 mL/min   Comment:            The eGFR has been calculated     using the CKD EPI equation.     This calculation has not  been     validated in all clinical     situations.     eGFR's persistently     <90 mL/min signify     possible Chronic Kidney Disease.  GLUCOSE, CAPILLARY     Status: Abnormal   Collection Time    03/19/13  5:48 AM      Result Value Range   Glucose-Capillary 114 (*) 70 - 99 mg/dL  GLUCOSE, CAPILLARY     Status: Abnormal   Collection Time    03/19/13  5:48 AM      Result Value Range   Glucose-Capillary 114 (*) 70 - 99 mg/dL    Disposition:      Follow-up Information   Follow up with Runell Gess, MD. (office will call you)    Contact information:   [address] [place] [address] [phone]       Discharge Medications:    Medication List    STOP taking these medications       amLODipine 10 MG tablet  Commonly known as:  NORVASC     hydrALAZINE 25 MG tablet  Commonly known as:  APRESOLINE     isosorbide mononitrate 60 MG 24 hr tablet  Commonly known as:  IMDUR     ramipril 1.25 MG capsule  Commonly known as:  ALTACE      TAKE these medications       acetaminophen 325 MG tablet  Commonly known as:  TYLENOL  Take 2 tablets (650 mg total) by mouth every 4 (four) hours as needed for pain or fever.     aspirin 81 MG EC tablet  Take 1 tablet (81 mg total) by mouth daily.     atorvastatin 40 MG tablet  Commonly known as:  LIPITOR  Take 40 mg by mouth daily.     calcium-vitamin D 500-200 MG-UNIT per tablet  Commonly known as:  OSCAL WITH D  Take 1 tablet by mouth daily.     carvedilol 3.125 MG tablet  Commonly known as:  COREG  Take 3 tablets (9.375 mg total) by mouth 2 (two) times daily with a meal.     CENTRUM SILVER PO  Take 1 tablet by mouth daily with supper.     clopidogrel 75 MG tablet  Commonly known as:  PLAVIX  Take 75 mg by mouth daily.     Digoxin 62.5 MCG Tabs  Take 0.0625 mg by mouth daily.     furosemide 40 MG tablet  Commonly known as:  LASIX  Take 1 tablet (40 mg total) by mouth daily.     furosemide  20 MG tablet  Commonly known as:  LASIX  Take 40mg  in am, 20mg  in the afternoon- around 4-5pm     gabapentin 100 MG capsule  Commonly known as:  NEURONTIN  Take 100-200 mg by mouth. Take 100mg  in the morning and 200mg  at supper     insulin glargine 100 UNIT/ML injection  Commonly known as:  LANTUS  Inject 0.1 mLs (10 Units total) into the skin at bedtime.     insulin lispro 100 UNIT/ML injection  Commonly known as:  HUMALOG  Inject 3 Units into the skin. Inject 3 units with lunch and dinner     levothyroxine 75 MCG tablet  Commonly known as:  SYNTHROID, LEVOTHROID  Take 75 mcg by mouth daily.     magnesium oxide 400 MG tablet  Commonly known as:  MAG-OX  Take 400 mg by mouth daily with supper.     nitroGLYCERIN 0.4 MG SL tablet  Commonly known as:  NITROSTAT  Place 1 tablet (0.4 mg total) under the tongue every 5 (five) minutes x 3 doses as needed for chest pain.     oxyCODONE 5 MG immediate release tablet  Commonly known as:  Oxy IR/ROXICODONE  Take 5 mg by mouth every 6 (six) hours as needed for pain.     pantoprazole 40 MG tablet  Commonly known as:  PROTONIX  Take 40 mg by mouth at bedtime.     potassium chloride SA 20 MEQ tablet  Commonly known as:  K-DUR,KLOR-CON  Take 20 mEq by mouth daily.     promethazine 25 MG tablet  Commonly known as:  PHENERGAN  Take 25 mg by mouth daily as needed for nausea. Takes 6.25mg  ( 1/4 tab) at home for nausea prn         Duration of Discharge Encounter: Greater than 30 minutes including physician time.  Jolene Provost PA-C 03/19/2013 10:22 AM

## 2013-03-19 NOTE — Progress Notes (Signed)
Pt & family received d/c instructions from SW and pt d/c to [facility] via sons transport.  Amanda Pea, RN

## 2013-03-19 NOTE — Discharge Summary (Signed)
I have seen and evaluated the patient this AM along with Corine Shelter, PA. I agree with his findings, examination as well as impression recommendations.  Continues to do well on stable PO Lasix dose, renal function stable. Minimal LLE edema.  Otherwise, she remains stable from a BP & HR standpoint -- in rate controlled Afib with PVCs aberrantly conducted beats.  Glycemic control is good on current regimen.  All things are pointing towards d/c to Assisted Living today\  Will arrange TCM10-14 f/u with APP then with Dr. Bishop Limbo - hopefully  Marykay Lex, M.D., M.S. [facility] [address] [place], [place]  [address]  [phone] Pager # [phone] 03/19/2013 12:44 PM

## 2013-03-19 NOTE — Progress Notes (Signed)
Subjective:  No SOB. Going to [facility]  Objective:  Vital Signs in the last 24 hours: Temp:  [98 F (36.7 C)-98.4 F (36.9 C)] 98.1 F (36.7 C) (06/16 0521) Pulse Rate:  [77-108] 82 (06/16 0700) Resp:  [18-20] 20 (06/16 0521) BP: (121-128)/(60-109) 122/60 mmHg (06/16 0700) SpO2:  [91 %-98 %] 95 % (06/16 0521) Weight:  [62.1 kg (136 lb 14.5 oz)] 62.1 kg (136 lb 14.5 oz) (06/16 0521)  Intake/Output from previous day:  Intake/Output Summary (Last 24 hours) at 03/19/13 1004 Last data filed at 03/19/13 0823  Gross per 24 hour  Intake    700 ml  Output   1176 ml  Net   -476 ml    Physical Exam: General appearance: alert, cooperative and no distress Lungs: few crackles Lt base Heart: irregularly irregular rhythm Extrem: Trace edema on Lt   Rate: 80  Rhythm: atrial fibrillation and frequent PVCs  Lab Results: No results found for this basename: WBC, HGB, PLT,  in the last 72 hours  Recent Labs  03/18/13 0400 03/19/13 0505  NA 139 142  K 3.4* 3.2*  CL 104 104  CO2 28 30  GLUCOSE 149* 107*  BUN 23 23  CREATININE 1.39* 1.44*   No results found for this basename: TROPONINI, CK, MB,  in the last 72 hours Hepatic Function Panel No results found for this basename: PROT, ALBUMIN, AST, ALT, ALKPHOS, BILITOT, BILIDIR, IBILI,  in the last 72 hours No results found for this basename: CHOL,  in the last 72 hours No results found for this basename: INR,  in the last 72 hours  Imaging: Imaging results have been reviewed  Cardiac Studies:  Assessment/Plan:   Principal Problem:   Acute systolic CHF (congestive heart failure) Active Problems:   PAF. (not felt to be a Coumadin candidate)   CABG 2000. PCI 2/10 to RI, cath 04/10/12- medical Rx. EF 50-55%   CKD (chronic kidney disease), stage III   Acute on chronic renal insufficiency   Anemia, heme negative. This appears to be chronic anemia   Cardiomyopathy, ischemic, 35-40% EF by echo 03/08/13   Hypertension   PVD, RFA  PTA 2/12   Hyperlipemia   CVA, Rt brain, 4/13, ASA stopped, Plavix continued   LBBB (left bundle branch block)   Bradycardia in past, currently tolerating Coreg   DM (diabetes mellitus)   Premature ventricular contractions (PVCs) (VPCs)   PLAN: OK to go to [facility]. Lasix 40/20 for now. Early f/u with APP.  Corine Shelter PA-C Beeper [phone] 03/19/2013, 10:04 AM  I have seen and evaluated the patient this AM along with Corine Shelter, PA. I agree with his findings, examination as well as impression recommendations.  Continues to do well on stable PO Lasix dose, renal function stable.  Minimal LLE edema.  Otherwise, she remains stable from a BP & HR standpoint -- in rate controlled Afib with PVCs aberrantly conducted beats.  Glycemic control is good on current regimen.  All things are pointing towards d/c to Assisted Living today\ Will arrange TCM10-14 f/u with APP then with Dr. Bishop Limbo - hopefully   Will have social work/case manager assist with this effort.   MD Time with pt: 10 min  HARDING,DAVID W, M.D., M.S. [facility] [address] [place], [place]  [address]  [phone] Pager # [phone] 03/19/2013 10:21 AM

## 2013-03-19 NOTE — Progress Notes (Signed)
Pt and her son asking for time  time when pt is going to be d/c.  Informed them that [place] is working on getting papers for d/c.  Lupita Leash SW. Informed and in to see pt  And her son and explained paper work with pt and her son and daughter-in-law.  Amanda Pea, Charity fundraiser.

## 2013-03-19 NOTE — Progress Notes (Signed)
PT Cancellation Note  Patient Details Name: RAZIYAH VANVLECK MRN: [id number] DOB: Nov 15, 1924   Cancelled Treatment:     Pt requesting to hold therapy for today due to she is d/cing today & wants to rest & conserve her energy for transfer to next venue.       Verdell Face, [place] [phone] 03/19/2013

## 2013-03-19 NOTE — Plan of Care (Signed)
Problem: Phase III Progression Outcomes Goal: Sinus rhythm established or heart rate < 100 at rest Outcome: Completed/Met Date Met:  03/19/13 Pt continues on A fib with rate control 80 -90's

## 2013-03-20 ENCOUNTER — Telehealth: Payer: Self-pay | Admitting: Physician Assistant

## 2013-03-20 NOTE — Telephone Encounter (Signed)
TCM phone call today.  No answer at the listed home number or at her son's mobile number.  Catherine Clayton 1:26 PM

## 2013-03-22 NOTE — Progress Notes (Signed)
Ok per MD for d/c today to [address] of [place]- Assisted Living level.  Patient's son Ferne Reus will transport via car.  Notified Tessa at [facility]- DC papers reviewed and approved for admission today. Patient's nurse was notified of above. Patient and her son are pleased with d/c arrangements.  No further CSW needs identified..  CSW signing off.  Lorri Frederick. [place]  [phone]

## 2013-04-02 ENCOUNTER — Ambulatory Visit: Payer: Medicare Other | Admitting: Cardiology

## 2013-04-06 ENCOUNTER — Other Ambulatory Visit: Payer: Self-pay | Admitting: Cardiovascular Disease

## 2013-04-09 ENCOUNTER — Other Ambulatory Visit (HOSPITAL_COMMUNITY): Payer: Self-pay | Admitting: Cardiovascular Disease

## 2013-04-09 DIAGNOSIS — I739 Peripheral vascular disease, unspecified: Secondary | ICD-10-CM

## 2013-04-09 NOTE — Telephone Encounter (Signed)
Rx was sent to pharmacy electronically. 

## 2013-04-12 ENCOUNTER — Ambulatory Visit (INDEPENDENT_AMBULATORY_CARE_PROVIDER_SITE_OTHER): Payer: Medicare Other | Admitting: Cardiology

## 2013-04-12 ENCOUNTER — Encounter: Payer: Self-pay | Admitting: Cardiology

## 2013-04-12 VITALS — BP 150/60 | HR 67 | Ht 68.0 in | Wt 136.9 lb

## 2013-04-12 DIAGNOSIS — I255 Ischemic cardiomyopathy: Secondary | ICD-10-CM

## 2013-04-12 DIAGNOSIS — I4891 Unspecified atrial fibrillation: Secondary | ICD-10-CM

## 2013-04-12 DIAGNOSIS — I10 Essential (primary) hypertension: Secondary | ICD-10-CM

## 2013-04-12 DIAGNOSIS — I2589 Other forms of chronic ischemic heart disease: Secondary | ICD-10-CM

## 2013-04-12 DIAGNOSIS — M549 Dorsalgia, unspecified: Secondary | ICD-10-CM

## 2013-04-12 DIAGNOSIS — R001 Bradycardia, unspecified: Secondary | ICD-10-CM

## 2013-04-12 DIAGNOSIS — M1990 Unspecified osteoarthritis, unspecified site: Secondary | ICD-10-CM

## 2013-04-12 DIAGNOSIS — I498 Other specified cardiac arrhythmias: Secondary | ICD-10-CM

## 2013-04-12 DIAGNOSIS — I5021 Acute systolic (congestive) heart failure: Secondary | ICD-10-CM

## 2013-04-12 DIAGNOSIS — I509 Heart failure, unspecified: Secondary | ICD-10-CM

## 2013-04-12 DIAGNOSIS — I480 Paroxysmal atrial fibrillation: Secondary | ICD-10-CM

## 2013-04-12 DIAGNOSIS — N183 Chronic kidney disease, stage 3 unspecified: Secondary | ICD-10-CM

## 2013-04-12 NOTE — Assessment & Plan Note (Signed)
Her Dtr requested ortho referral for chronic DJD of her mother's back. She has had prior kyphoplasty but was turned down for this by her Orthopedist in [place]

## 2013-04-12 NOTE — Assessment & Plan Note (Addendum)
She is in AF with LBBB. Her rate is a little slow today. She may be taking Lanoxin 0.125mg  instead of 0/0625mg .

## 2013-04-12 NOTE — Patient Instructions (Addendum)
Decrease Lanoxin to 1/2 of a 0.125 mg tablet daily. If her heart rate is less than 60- only take 6.25 mg of Coreg.  We will try and arrange referal to [facility].

## 2013-04-12 NOTE — Assessment & Plan Note (Signed)
But HR is on the slow side at 60. No syncope

## 2013-04-12 NOTE — Assessment & Plan Note (Signed)
Controlled.  

## 2013-04-12 NOTE — Assessment & Plan Note (Signed)
Discharged 03/19/13 then spent two weeks in assisted living, now back home

## 2013-04-12 NOTE — Progress Notes (Signed)
04/12/2013 Catherine Clayton   Feb 26, 1925  [id number]  Primary Physicia WHYTE,THOMAS M Primary Cardiologist: Dr Allyson Sabal  HPI:  The patient is a 79 y/o Caucasian female, followed at [facility] by Dr. Allyson Sabal. She has a history of CAD, status post coronary artery bypass grafting in 2000. She has known PVOD, status post stenting of her left SFA (which by Dopplers appear occluded) as well as hypertension, hyperlipidemia, and diabetes. She was last catheterized by Dr. Tresa Endo in the setting of a non-STEMI on April 10, 2012, revealing a patent vein graft to an RCA, patent LIMA to an occluded LAD, and a patent stent to a ramus branch with an EF of 50% to 55%. Plan was for medical Rx. She has PAF as well as PVCs.. She did have a stroke earlier in 2013 and it was decided to keep her on aspirin and Plavix as opposed to Coumadin, given her age.        The patient presented to [facility], in [place], 03/08/13 with CHF. She was also noted to be in atrial fibrillation with a controlled response. Admission wgt was 69.9Kg, discharge wgt 62.1kg. Echo was done revealing an EF of 35-40%. She responded to diuresis. It was decided she was not a Coumadin candidate.  She has CRI with a baseline SCr of 1.5. ACE was discontinued during her last hospitalization.. We added beta blocker for AF and gently increased the dose for rate as she has had bradycardia in the past on Coreg. She was discharge to assisted living and spent two weeks there. She is now back home, living alone with an aid checking on her. She is in the office today for follow up. She is doing well from a cardiac standpoint though her HR is on the low side at 60. She is having some back issues and her daughter wanted a re feral to an orthopedist. She has a back doctor in [place] and has had prior kyphoplasty but they said he turned her down for further procedure.    Current Outpatient Prescriptions  Medication Sig Dispense Refill  . acetaminophen (TYLENOL) 325 MG tablet Take 2  tablets (650 mg total) by mouth every 4 (four) hours as needed for pain or fever.      Marland Kitchen aspirin EC 81 MG EC tablet Take 1 tablet (81 mg total) by mouth daily.      Marland Kitchen atorvastatin (LIPITOR) 40 MG tablet Take 40 mg by mouth daily.      . calcium-vitamin D (OSCAL WITH D) 500-200 MG-UNIT per tablet Take 1 tablet by mouth daily.      . carvedilol (COREG) 3.125 MG tablet Take 3 tablets (9.375 mg total) by mouth 2 (two) times daily with a meal.  100 tablet  11  . clopidogrel (PLAVIX) 75 MG tablet Take 75 mg by mouth daily.      . digoxin 62.5 MCG TABS Take 0.0625 mg by mouth daily.  30 tablet  5  . furosemide (LASIX) 20 MG tablet , 20mg  in the afternoon- around 4-5pm      . furosemide (LASIX) 40 MG tablet Take 1 tablet (40 mg total) by mouth daily.  30 tablet  11  . gabapentin (NEURONTIN) 100 MG capsule Take 100-200 mg by mouth. Take 100mg  in the morning and 200mg  at supper      . insulin glargine (LANTUS) 100 UNIT/ML injection Inject 12 Units into the skin at bedtime.      . insulin lispro (HUMALOG) 100 UNIT/ML injection Inject 3 Units  into the skin. Inject 3 units with lunch and dinner      . levothyroxine (SYNTHROID, LEVOTHROID) 75 MCG tablet Take 75 mcg by mouth daily.      . magnesium oxide (MAG-OX) 400 MG tablet Take 400 mg by mouth daily with supper.      . Multiple Vitamins-Minerals (CENTRUM SILVER PO) Take 1 tablet by mouth daily with supper.      . nitroGLYCERIN (NITROSTAT) 0.4 MG SL tablet Place 1 tablet (0.4 mg total) under the tongue every 5 (five) minutes x 3 doses as needed for chest pain.  25 tablet  4  . oxyCODONE (OXY IR/ROXICODONE) 5 MG immediate release tablet Take 5 mg by mouth every 6 (six) hours as needed for pain.      . pantoprazole (PROTONIX) 40 MG tablet Take 40 mg by mouth at bedtime.      . potassium chloride SA (K-DUR,KLOR-CON) 20 MEQ tablet TAKE 1 TABLET DAILY  90 tablet  0  . promethazine (PHENERGAN) 25 MG tablet Take 25 mg by mouth daily as needed for nausea. Takes  6.25mg  ( 1/4 tab) at home for nausea prn       No current facility-administered medications for this visit.    Allergies  Allergen Reactions  . Influenza Vaccines Swelling  . Demerol Nausea And Vomiting  . Promethazine Other (See Comments)    Pt. Unsure   Can use 12.5 mg of meds  . Zithromax (Azithromycin) Nausea And Vomiting  . Penicillins Rash    History   Social History  . Marital Status: Widowed    Spouse Name: N/A    Number of Children: N/A  . Years of Education: N/A   Occupational History  . Not on file.   Social History Main Topics  . Smoking status: Never Smoker   . Smokeless tobacco: Never Used  . Alcohol Use: No  . Drug Use: No  . Sexually Active: Not Currently   Other Topics Concern  . Not on file   Social History Narrative  . No narrative on file     Review of Systems: General: negative for chills, fever, night sweats or weight changes.  Cardiovascular: negative for chest pain, dyspnea on exertion, edema, orthopnea, palpitations, paroxysmal nocturnal dyspnea or shortness of breath Dermatological: negative for rash Respiratory: negative for cough or wheezing Urologic: negative for hematuria Abdominal: negative for nausea, vomiting, diarrhea, bright red blood per rectum, melena, or hematemesis Neurologic: negative for visual changes, syncope, or dizziness All other systems reviewed and are otherwise negative except as noted above.    Blood pressure 150/60, pulse 67, height 5\' 8"  (1.727 m), weight 136 lb 14.4 oz (62.097 kg).  General appearance: alert, cooperative, no distress and frail, kyphotic Lungs: decreased breath sounds Heart: irregularly irregular rhythm  EKG  AF, PVCs, LBBB  ASSESSMENT AND PLAN:   PAF. (not felt to be a Coumadin candidate) She is in AF with LBBB. Her rate is a little slow today. She may be taking Lanoxin 0.125mg  instead of 0/0625mg .  Acute systolic CHF (congestive heart failure) Discharged 03/19/13 then spent two weeks  in assisted living, now back home  CKD (chronic kidney disease), stage III SCr 1.44 at discharge  Cardiomyopathy, ischemic, 35-40% EF by echo 03/08/13 .  Bradycardia in past, currently tolerating Coreg But HR is on the slow side at 60. No syncope  Hypertension Controlled   DJD (degenerative joint disease) Her Dtr requested ortho referral for chronic DJD of her mother's back. She has had  prior kyphoplasty but was turned down for this by her Orthopedist in [place]   PLAN: She may been taking too much Lanoxin and I asked them to make sure the dose is correct. I have asked her to decrease her Coreg to 6.25 mg if her HR is less than 60.   [facility] KPA-C 04/12/2013 5:46 PM

## 2013-04-12 NOTE — Assessment & Plan Note (Signed)
SCr 1.44 at discharge

## 2013-04-14 ENCOUNTER — Other Ambulatory Visit: Payer: Self-pay | Admitting: Cardiovascular Disease

## 2013-04-16 NOTE — Telephone Encounter (Signed)
Rx was sent to pharmacy electronically. 

## 2013-05-03 ENCOUNTER — Other Ambulatory Visit: Payer: Self-pay | Admitting: *Deleted

## 2013-05-03 MED ORDER — GABAPENTIN 100 MG PO CAPS: 100.0000 mg | ORAL_CAPSULE | Freq: Every day | ORAL | Status: DC

## 2013-05-03 MED ORDER — CARVEDILOL 3.125 MG PO TABS: 9.3750 mg | ORAL_TABLET | Freq: Two times a day (BID) | ORAL | Status: DC

## 2013-05-08 ENCOUNTER — Other Ambulatory Visit: Payer: Self-pay

## 2013-05-08 MED ORDER — ATORVASTATIN CALCIUM 40 MG PO TABS: 40.0000 mg | ORAL_TABLET | Freq: Every day | ORAL | Status: DC

## 2013-05-08 NOTE — Telephone Encounter (Signed)
Rx was sent to pharmacy electronically. 

## 2013-05-16 ENCOUNTER — Other Ambulatory Visit: Payer: Self-pay | Admitting: *Deleted

## 2013-05-16 MED ORDER — FUROSEMIDE 40 MG PO TABS: 40.0000 mg | ORAL_TABLET | Freq: Every day | ORAL | Status: DC

## 2013-05-25 ENCOUNTER — Telehealth: Payer: Self-pay | Admitting: *Deleted

## 2013-05-25 ENCOUNTER — Other Ambulatory Visit: Payer: Self-pay | Admitting: *Deleted

## 2013-05-25 MED ORDER — FUROSEMIDE 20 MG PO TABS: ORAL_TABLET | ORAL | Status: DC

## 2013-05-25 NOTE — Telephone Encounter (Signed)
Stanton Kidney called in and inquired about Ms Troyer's furosemide prescription.  She said that [facility] could not fill the RX for furosemide and her mom was almost out of the med.  She said that her mom was taking Furosemide 40mg  qam and 20mg  qpm.  I looked in Epic.  A refill for 40mg  was refilled on 8/13 and confirmed by [facility].  Debra asked that i send in a RX for 20mg  tid.  I sent in the RX.  I called [facility].  They shipped the 40mg  of Furosemide on 8/15 (they had it in the system that she was taking a 40mg  tab and a 20mg  tab) I explained to Stanton Kidney that the med had already shipped, but per Jodie at [facility] the 20mg  tid RX that I sent today would override the 40mg .  They will process the 20mg  tid and send that out.  Debra verbalized understanding and asked me to send in local RX for 30 day supply.

## 2013-07-02 ENCOUNTER — Other Ambulatory Visit: Payer: Self-pay | Admitting: Cardiovascular Disease

## 2013-07-02 NOTE — Telephone Encounter (Signed)
Rx was sent to pharmacy electronically. 

## 2013-07-21 ENCOUNTER — Other Ambulatory Visit: Payer: Self-pay | Admitting: Cardiovascular Disease

## 2013-07-23 NOTE — Telephone Encounter (Signed)
Rx was sent to pharmacy electronically. 

## 2013-08-08 ENCOUNTER — Other Ambulatory Visit: Payer: Self-pay | Admitting: Cardiology

## 2013-08-08 ENCOUNTER — Encounter: Payer: Self-pay | Admitting: Cardiology

## 2013-08-08 ENCOUNTER — Ambulatory Visit (INDEPENDENT_AMBULATORY_CARE_PROVIDER_SITE_OTHER): Payer: Medicare Other | Admitting: Cardiology

## 2013-08-08 VITALS — BP 122/60 | HR 69 | Ht 66.0 in | Wt 132.0 lb

## 2013-08-08 DIAGNOSIS — M1990 Unspecified osteoarthritis, unspecified site: Secondary | ICD-10-CM

## 2013-08-08 DIAGNOSIS — I498 Other specified cardiac arrhythmias: Secondary | ICD-10-CM

## 2013-08-08 DIAGNOSIS — I2589 Other forms of chronic ischemic heart disease: Secondary | ICD-10-CM

## 2013-08-08 DIAGNOSIS — I2510 Atherosclerotic heart disease of native coronary artery without angina pectoris: Secondary | ICD-10-CM

## 2013-08-08 DIAGNOSIS — I255 Ischemic cardiomyopathy: Secondary | ICD-10-CM

## 2013-08-08 DIAGNOSIS — N183 Chronic kidney disease, stage 3 unspecified: Secondary | ICD-10-CM

## 2013-08-08 DIAGNOSIS — R001 Bradycardia, unspecified: Secondary | ICD-10-CM

## 2013-08-08 DIAGNOSIS — I480 Paroxysmal atrial fibrillation: Secondary | ICD-10-CM

## 2013-08-08 DIAGNOSIS — N189 Chronic kidney disease, unspecified: Secondary | ICD-10-CM

## 2013-08-08 DIAGNOSIS — I4891 Unspecified atrial fibrillation: Secondary | ICD-10-CM

## 2013-08-08 LAB — BASIC METABOLIC PANEL
BUN: 33 mg/dL — ABNORMAL HIGH (ref 6–23)
CO2: 30 mEq/L (ref 19–32)
Calcium: 10.1 mg/dL (ref 8.4–10.5)
Chloride: 103 mEq/L (ref 96–112)
Creat: 1.61 mg/dL — ABNORMAL HIGH (ref 0.50–1.10)
Glucose, Bld: 85 mg/dL (ref 70–99)
Potassium: 4.4 mEq/L (ref 3.5–5.3)
Sodium: 142 mEq/L (ref 135–145)

## 2013-08-08 NOTE — Assessment & Plan Note (Signed)
HR 68

## 2013-08-08 NOTE — Assessment & Plan Note (Signed)
Currently she is doing well with her back

## 2013-08-08 NOTE — Assessment & Plan Note (Signed)
She appears to be in AF with CVR today

## 2013-08-08 NOTE — Progress Notes (Signed)
08/08/2013 Catherine Clayton   1925-05-28  [id number]  Primary Physicia WHYTE,THOMAS M Primary Cardiologist: Dr Allyson Sabal  HPI:  The patient is a 79 y/o  female, followed by Dr. Allyson Sabal. She has a history of CAD, status post CABG x 4 in 2000. She has known PVOD, status post stenting of her left SFA (which by Dopplers appear occluded) as well as AF, LBBB, hypertension, hyperlipidemia, and diabetes. She was last catheterized by Dr. Tresa Endo in the setting of a non-STEMI on April 10, 2012 and treated medically. She did have a stroke earlier in 2013. It was decided to keep her on aspirin and Plavix as opposed to Coumadin, given her age.  The patient presented to [facility], in [place], 03/08/13 with CHF. Echo then showed a drop in her EF to 35-40%. She has done well since. She has CRI with a baseline SCr of 1.5. ACE was discontinued during her last hospitalization. She was discharge to assisted living after that hospitalization and spent two weeks there. She is now back home, living alone with an aid checking on her.         She is in the office today with her daughter for follow up. The pt gets around with a rolling walker. She is doing well from a cardiac standpoint.  Her HR is in the 60s. She was having some back issues but her daughter says that this is currently stable and no procedures or injections are planned.    Current Outpatient Prescriptions  Medication Sig Dispense Refill  . acetaminophen (TYLENOL) 325 MG tablet Take 2 tablets (650 mg total) by mouth every 4 (four) hours as needed for pain or fever.      Marland Kitchen atorvastatin (LIPITOR) 40 MG tablet Take 1 tablet (40 mg total) by mouth daily.  90 tablet  3  . calcium-vitamin D (OSCAL WITH D) 500-200 MG-UNIT per tablet Take 1 tablet by mouth daily.      . carvedilol (COREG) 3.125 MG tablet Take 3 tablets (9.375 mg total) by mouth 2 (two) times daily with a meal.  540 tablet  3  . clopidogrel (PLAVIX) 75 MG tablet Take 75 mg by mouth daily.      . digoxin  62.5 MCG TABS Take 0.0625 mg by mouth daily.  30 tablet  5  . furosemide (LASIX) 20 MG tablet TAKE ONE TABLET THREE TIMES A DAY  90 tablet  2  . gabapentin (NEURONTIN) 100 MG capsule Take 300 mg by mouth daily.      . insulin glargine (LANTUS) 100 UNIT/ML injection Inject 40 Units into the skin at bedtime.       . insulin lispro (HUMALOG) 100 UNIT/ML injection Inject 5 Units into the skin. Inject 3 units with lunch and dinner      . levothyroxine (SYNTHROID, LEVOTHROID) 75 MCG tablet Take 75 mcg by mouth daily.      . magnesium oxide (MAG-OX) 400 MG tablet Take 400 mg by mouth daily with supper.      . Multiple Vitamins-Minerals (CENTRUM SILVER PO) Take 1 tablet by mouth daily with supper.      Marland Kitchen oxyCODONE (OXY IR/ROXICODONE) 5 MG immediate release tablet Take 5 mg by mouth every 6 (six) hours as needed for pain.      . pantoprazole (PROTONIX) 40 MG tablet Take 40 mg by mouth at bedtime.      . potassium chloride SA (K-DUR,KLOR-CON) 20 MEQ tablet TAKE 1 TABLET DAILY  90 tablet  2  . promethazine (  PHENERGAN) 25 MG tablet Take 25 mg by mouth daily as needed for nausea. Takes 6.25mg  ( 1/4 tab) at home for nausea prn      . nitroGLYCERIN (NITROSTAT) 0.4 MG SL tablet Place 1 tablet (0.4 mg total) under the tongue every 5 (five) minutes x 3 doses as needed for chest pain.  25 tablet  4   No current facility-administered medications for this visit.    Allergies  Allergen Reactions  . Influenza Vaccines Swelling  . Demerol Nausea And Vomiting  . Promethazine Other (See Comments)    Pt. Unsure   Can use 12.5 mg of meds  . Zithromax [Azithromycin] Nausea And Vomiting  . Penicillins Rash    History   Social History  . Marital Status: Widowed    Spouse Name: N/A    Number of Children: N/A  . Years of Education: N/A   Occupational History  . Not on file.   Social History Main Topics  . Smoking status: Never Smoker   . Smokeless tobacco: Never Used  . Alcohol Use: No  . Drug Use: No  .  Sexual Activity: Not Currently   Other Topics Concern  . Not on file   Social History Narrative  . No narrative on file     Review of Systems: General: negative for chills, fever, night sweats or weight changes.  Cardiovascular: negative for chest pain, dyspnea on exertion, edema, orthopnea, palpitations, paroxysmal nocturnal dyspnea or shortness of breath Dermatological: negative for rash Respiratory: negative for cough or wheezing Urologic: negative for hematuria Abdominal: negative for nausea, vomiting, diarrhea, bright red blood per rectum, melena, or hematemesis Neurologic: negative for visual changes, syncope, or dizziness All other systems reviewed and are otherwise negative except as noted above.    Blood pressure 122/60, pulse 69, height 5\' 6"  (1.676 m), weight 132 lb (59.875 kg).  General appearance: alert, cooperative and no distress Neck: no carotid bruit and no JVD Lungs: clear to auscultation bilaterally Heart: irregularly irregular rhythm Extremities: no edma  EKG AF, LBBB, PVC- rate 68  ASSESSMENT AND PLAN:   PAF. (not felt to be a Coumadin candidate) She appears to be in AF with CVR today  CABG 2000. PCI 2/10 to RI, cath 04/10/12- medical Rx.  No angina, doing well  CKD (chronic kidney disease), stage III Check lab today  Cardiomyopathy, ischemic, 35-40% EF by echo 03/08/13 No CHF symptoms  Bradycardia in past, currently tolerating Coreg HR 68  DJD (degenerative joint disease) Currently she is doing well with her back   PLAN  Same cardiac Rx. I did order a BMP today. She should see Dr Allyson Sabal in 6 months.  Riniyah Speich KPA-C 08/08/2013 10:21 AM

## 2013-08-08 NOTE — Patient Instructions (Signed)
Your physician recommends that you schedule a follow-up appointment in: 6 months with Dr Allyson Sabal Your physician recommends that you return for lab work today

## 2013-08-08 NOTE — Assessment & Plan Note (Signed)
No CHF symptoms 

## 2013-08-08 NOTE — Assessment & Plan Note (Signed)
No angina, doing well. 

## 2013-08-08 NOTE — Assessment & Plan Note (Signed)
Check lab today

## 2013-08-10 ENCOUNTER — Telehealth: Payer: Self-pay | Admitting: *Deleted

## 2013-08-10 NOTE — Telephone Encounter (Signed)
Pt's daughter called stating that she would like the results of her mother's bloodwork

## 2013-08-10 NOTE — Telephone Encounter (Signed)
Pt .informed of lab work and stated understanding of instructions

## 2013-08-10 NOTE — Telephone Encounter (Signed)
Message forwarded to J.C. Wildman, LPN.  

## 2013-08-14 NOTE — Telephone Encounter (Signed)
Message forwarded to J.C. Wildman, LPN.  

## 2013-08-14 NOTE — Telephone Encounter (Signed)
Stiill waiting for blood test results.Also have other questions. She will not be available from 12 to 2.

## 2013-08-22 ENCOUNTER — Telehealth: Payer: Self-pay | Admitting: Cardiovascular Disease

## 2013-08-22 NOTE — Telephone Encounter (Signed)
At last visit with [facility] Nov 5th she was given instructions.  Wants a nurse to call him to make sure his mom has the instructions correct.

## 2013-08-22 NOTE — Telephone Encounter (Signed)
Returned call and pt verified x 2 w/ son, Windy Fast.  Stated when pt saw Franky Macho on the 5th, he told her to increase her banana to a whole one a day.  Stated she was eating a 1/2 of a banana a day.  Son concerned about pt's blood sugars from increasing the banana.  Stated they have been okay, but have gotten up to the 120s.  Stated it took several months to get her sugars to the normal range and he doesn't want it to start going back up again.  Son informed K+ was 4.4, which is still in the normal range.  Also informed BG of 120 is not critical, although slightly elevated.  Informed Corine Shelter, PA-C will be notified to advise how long pt to eat one banana/day and RN will call back.  Verbalized understanding.  Message forwarded to Hinda Glatter, PA-C for further instructions.

## 2014-01-29 ENCOUNTER — Encounter: Payer: Self-pay | Admitting: Cardiovascular Disease

## 2014-01-29 ENCOUNTER — Ambulatory Visit (INDEPENDENT_AMBULATORY_CARE_PROVIDER_SITE_OTHER): Payer: Medicare Other | Admitting: Cardiovascular Disease

## 2014-01-29 VITALS — BP 138/66 | HR 87 | Ht 66.0 in | Wt 136.6 lb

## 2014-01-29 DIAGNOSIS — R001 Bradycardia, unspecified: Secondary | ICD-10-CM

## 2014-01-29 DIAGNOSIS — E785 Hyperlipidemia, unspecified: Secondary | ICD-10-CM

## 2014-01-29 DIAGNOSIS — I10 Essential (primary) hypertension: Secondary | ICD-10-CM

## 2014-01-29 DIAGNOSIS — I4891 Unspecified atrial fibrillation: Secondary | ICD-10-CM

## 2014-01-29 DIAGNOSIS — I739 Peripheral vascular disease, unspecified: Secondary | ICD-10-CM

## 2014-01-29 DIAGNOSIS — I498 Other specified cardiac arrhythmias: Secondary | ICD-10-CM

## 2014-01-29 DIAGNOSIS — I480 Paroxysmal atrial fibrillation: Secondary | ICD-10-CM

## 2014-01-29 DIAGNOSIS — I2510 Atherosclerotic heart disease of native coronary artery without angina pectoris: Secondary | ICD-10-CM

## 2014-01-29 MED ORDER — CARVEDILOL 3.125 MG PO TABS: 9.3750 mg | ORAL_TABLET | Freq: Two times a day (BID) | ORAL | Status: DC

## 2014-01-29 MED ORDER — GABAPENTIN 100 MG PO CAPS: 300.0000 mg | ORAL_CAPSULE | Freq: Every day | ORAL | Status: DC

## 2014-01-29 NOTE — Assessment & Plan Note (Signed)
B. King-size rhythm. It was decided that she was not a Coumadin candidate.

## 2014-01-29 NOTE — Assessment & Plan Note (Signed)
History of right common femoral artery intervention by myself 01/26/11. She does have a history of an occluded left SFA with one-vessel runoff on the right and 2 vessel on the left. She denies claudication.

## 2014-01-29 NOTE — Assessment & Plan Note (Signed)
On statin therapy with recent lipid profile performed by her PCP on 01/21/14 showing a total cholesterol of 136, LDL 64 HDL of 33

## 2014-01-29 NOTE — Patient Instructions (Signed)
Your physician recommends that you schedule a follow-up appointment in: 6 Months with [place] physician recommends that you schedule a follow-up appointment in: 12 Months with Dr Gwenlyn Found

## 2014-01-29 NOTE — Assessment & Plan Note (Signed)
History of coronary artery disease status post bypass grafting in 2000. She was recatheterized by Dr. Ellouise Newer 04/10/12 in the setting of a non-STEMI. Her EF at that time of 50-55% with apical hypokinesia. Subsequent echoes have shown EF in the 40-45% range. She denies chest pain or shortness of breath.

## 2014-01-29 NOTE — Progress Notes (Signed)
01/29/2014 Catherine Clayton   Nov 17, 1924  [id number]  Primary Physician [name] Primary Cardiologist: Lorretta Harp MD Renae Gloss   HPI:  The patient is a very pleasant 80 year old thin and frail-appearing widowed Caucasian female, mother of 2 and grandmother to 66 grandchildren, who is accompanied by one of her daughters today. I last saw her 6 months ago.   She has a history of CAD, status post coronary artery bypass grafting in 2000. She has known PVOD, status post stenting of her left SFA which by Dopplers appear occluded, as well as hypertension, hyperlipidemia, and diabetes. She has had colon cancer in the past treated with chemotherapy and radiation therapy. She was catheterized by Dr. Ellouise Newer in the setting of a non-STEMI on April 10, 2012, revealing patent veins to an RCA, patent LIMA to an occluded LAD, and a patent stent to a ramus branch with an EF of 50% to 55% and apical hypokinesia. I intervened on her right common femoral artery January 26, 2011, at which time I demonstrated an occluded left SFA secondary to "in-stent restenosis." Outpatient [facility] has shown some PAF as well as PVCs which were apparently conducted. She did have a stroke earlier last year and it was decided to keep her on aspirin and Plavix as opposed to Coumadin given her age. She currently complains of back pain and has a fractured L2.I saw her back in May of last year. She was hospitalized the following month in June because of the congestive heart failure. She saw Kerin Ransom back 08/08/13 and has been doing well since.   Current Outpatient Prescriptions  Medication Sig Dispense Refill  . acetaminophen (TYLENOL) 325 MG tablet Take 2 tablets (650 mg total) by mouth every 4 (four) hours as needed for pain or fever.      Marland Kitchen atorvastatin (LIPITOR) 40 MG tablet Take 1 tablet (40 mg total) by mouth daily.  90 tablet  3  . calcium-vitamin D (OSCAL WITH D) 500-200 MG-UNIT per tablet Take 1 tablet by  mouth daily.      . carvedilol (COREG) 3.125 MG tablet Take 3 tablets (9.375 mg total) by mouth 2 (two) times daily with a meal.  540 tablet  3  . clopidogrel (PLAVIX) 75 MG tablet Take 75 mg by mouth daily.      . digoxin 62.5 MCG TABS Take 0.0625 mg by mouth daily.  30 tablet  5  . furosemide (LASIX) 20 MG tablet TAKE ONE TABLET THREE TIMES A DAY  90 tablet  2  . gabapentin (NEURONTIN) 100 MG capsule Take 3 capsules (300 mg total) by mouth daily.  270 capsule  3  . insulin glargine (LANTUS) 100 UNIT/ML injection Inject 40 Units into the skin at bedtime.       . insulin lispro (HUMALOG) 100 UNIT/ML injection Inject 5 Units into the skin. Inject 3 units with lunch and dinner      . levothyroxine (SYNTHROID, LEVOTHROID) 75 MCG tablet Take 75 mcg by mouth daily.      . magnesium oxide (MAG-OX) 400 MG tablet Take 400 mg by mouth daily with supper.      . Multiple Vitamins-Minerals (CENTRUM SILVER PO) Take 1 tablet by mouth daily with supper.      . nitroGLYCERIN (NITROSTAT) 0.4 MG SL tablet Place 1 tablet (0.4 mg total) under the tongue every 5 (five) minutes x 3 doses as needed for chest pain.  25 tablet  4  . oxyCODONE (OXY IR/ROXICODONE)  5 MG immediate release tablet Take 5 mg by mouth every 6 (six) hours as needed for pain.      . pantoprazole (PROTONIX) 40 MG tablet Take 40 mg by mouth at bedtime.      . potassium chloride SA (K-DUR,KLOR-CON) 20 MEQ tablet TAKE 1 TABLET DAILY  90 tablet  2  . promethazine (PHENERGAN) 25 MG tablet Take 25 mg by mouth daily as needed for nausea. Takes 6.25mg  ( 1/4 tab) at home for nausea prn       No current facility-administered medications for this visit.    Allergies  Allergen Reactions  . Influenza Vaccines Swelling  . Demerol Nausea And Vomiting  . Promethazine Other (See Comments)    Pt. Unsure   Can use 12.5 mg of meds  . Zithromax [Azithromycin] Nausea And Vomiting  . Penicillins Rash    History   Social History  . Marital Status: Widowed     Spouse Name: N/A    Number of Children: N/A  . Years of Education: N/A   Occupational History  . Not on file.   Social History Main Topics  . Smoking status: Never Smoker   . Smokeless tobacco: Never Used  . Alcohol Use: No  . Drug Use: No  . Sexual Activity: Not Currently   Other Topics Concern  . Not on file   Social History Narrative  . No narrative on file     Review of Systems: General: negative for chills, fever, night sweats or weight changes.  Cardiovascular: negative for chest pain, dyspnea on exertion, edema, orthopnea, palpitations, paroxysmal nocturnal dyspnea or shortness of breath Dermatological: negative for rash Respiratory: negative for cough or wheezing Urologic: negative for hematuria Abdominal: negative for nausea, vomiting, diarrhea, bright red blood per rectum, melena, or hematemesis Neurologic: negative for visual changes, syncope, or dizziness All other systems reviewed and are otherwise negative except as noted above.    Blood pressure 138/66, pulse 87, height 5\' 6"  (1.676 m), weight 136 lb 9.6 oz (61.961 kg).  General appearance: alert and no distress Neck: no adenopathy, no JVD, supple, symmetrical, trachea midline, thyroid not enlarged, symmetric, no tenderness/mass/nodules and soft right carotid bruit Lungs: clear to auscultation bilaterally Abdomen: soft, non-tender; bowel sounds normal; no masses,  no organomegaly  EKG normal sinus rhythm at 65 with bigeminal PVCs a lipoma branch block  ASSESSMENT AND PLAN:   CABG 2000. PCI 2/10 to RI, cath 04/10/12- medical Rx.  History of coronary artery disease status post bypass grafting in 2000. She was recatheterized by Dr. Ellouise Newer 04/10/12 in the setting of a non-STEMI. Her EF at that time of 50-55% with apical hypokinesia. Subsequent echoes have shown EF in the 40-45% range. She denies chest pain or shortness of breath.  Hyperlipemia On statin therapy with recent lipid profile performed by her PCP on  01/21/14 showing a total cholesterol of 136, LDL 64 HDL of 33  PAF. (not felt to be a Coumadin candidate) B. King-size rhythm. It was decided that she was not a Coumadin candidate.  PVD, RFA PTA 2/12 History of right common femoral artery intervention by myself 01/26/11. She does have a history of an occluded left SFA with one-vessel runoff on the right and 2 vessel on the left. She denies claudication.      Lorretta Harp MD FACP,FACC,FAHA, [facility] 01/29/2014 4:13 PM

## 2014-02-05 ENCOUNTER — Telehealth: Payer: Self-pay | Admitting: Cardiovascular Disease

## 2014-02-05 MED ORDER — GABAPENTIN 100 MG PO CAPS: 300.0000 mg | ORAL_CAPSULE | Freq: Every day | ORAL | Status: DC

## 2014-02-05 NOTE — Telephone Encounter (Signed)
E -sent prescription  #30 DAY SUPPLY  QUANTITY OF 90 TABLETS AND 6 REFILL TO [facility] . LEFT MESSAGE ON  DEBRA'S VOICEMAIL.

## 2014-02-05 NOTE — Telephone Encounter (Signed)
Is asking if we can send a new prescription for Gabapentin to her local Pharmacy ([facility] on [address] in [place]) because [facility] does not have it available and do not know when they will .Marland Kitchen Please Call   Thanks

## 2014-02-20 ENCOUNTER — Other Ambulatory Visit: Payer: Self-pay | Admitting: Cardiovascular Disease

## 2014-02-20 NOTE — Telephone Encounter (Signed)
Rx refill sent to patient pharmacy   

## 2014-03-15 ENCOUNTER — Telehealth: Payer: Self-pay | Admitting: Cardiovascular Disease

## 2014-03-15 NOTE — Telephone Encounter (Signed)
New Prob   States pt was seen at [facility] recently. Notes should have been faxed over for Dr. Gwenlyn Found. He is calling to follow up and confirm notes were received. Please call.

## 2014-03-15 NOTE — Telephone Encounter (Signed)
Pt.s emergency contact called and informed that we did receive the information from [facility] visit

## 2014-03-18 ENCOUNTER — Other Ambulatory Visit (HOSPITAL_COMMUNITY): Payer: Self-pay | Admitting: Cardiology

## 2014-03-18 NOTE — Telephone Encounter (Signed)
Rx was sent to pharmacy electronically. 

## 2014-03-29 ENCOUNTER — Other Ambulatory Visit: Payer: Self-pay | Admitting: Cardiovascular Disease

## 2014-03-29 NOTE — Telephone Encounter (Signed)
Rx refill sent to patient pharmacy   

## 2014-04-12 ENCOUNTER — Other Ambulatory Visit: Payer: Self-pay | Admitting: Cardiovascular Disease

## 2014-04-12 NOTE — Telephone Encounter (Signed)
Rx was sent to pharmacy electronically. 

## 2014-04-23 ENCOUNTER — Encounter: Payer: Self-pay | Admitting: Cardiovascular Disease

## 2014-04-23 ENCOUNTER — Ambulatory Visit (INDEPENDENT_AMBULATORY_CARE_PROVIDER_SITE_OTHER): Payer: Medicare Other | Admitting: Cardiovascular Disease

## 2014-04-23 VITALS — BP 119/67 | HR 70 | Ht 66.0 in | Wt 136.0 lb

## 2014-04-23 DIAGNOSIS — E785 Hyperlipidemia, unspecified: Secondary | ICD-10-CM

## 2014-04-23 DIAGNOSIS — I2510 Atherosclerotic heart disease of native coronary artery without angina pectoris: Secondary | ICD-10-CM

## 2014-04-23 DIAGNOSIS — I10 Essential (primary) hypertension: Secondary | ICD-10-CM

## 2014-04-23 NOTE — Progress Notes (Signed)
04/23/2014 [place]   06/12/1925  [id number]  Primary Physician [name] Primary Cardiologist: Lorretta Harp MD Renae Gloss   HPI:  The patient is a very pleasant 80 year old thin and frail-appearing widowed Caucasian female, mother of 2 and grandmother to 67 grandchildren, who is accompanied by one of her daughters today. I last saw her 3 months ago.   She has a history of CAD, status post coronary artery bypass grafting in 2000. She has known PVOD, status post stenting of her left SFA which by Dopplers appear occluded, as well as hypertension, hyperlipidemia, and diabetes. She has had colon cancer in the past treated with chemotherapy and radiation therapy. She was catheterized by Dr. Ellouise Newer in the setting of a non-STEMI on April 10, 2012, revealing patent veins to an RCA, patent LIMA to an occluded LAD, and a patent stent to a ramus branch with an EF of 50% to 55% and apical hypokinesia. I intervened on her right common femoral artery January 26, 2011, at which time I demonstrated an occluded left SFA secondary to "in-stent restenosis." Outpatient [facility] has shown some PAF as well as PVCs which were apparently conducted. She did have a stroke earlier last year and it was decided to keep her on aspirin and Plavix as opposed to Coumadin given her age. She currently complains of back pain and has a fractured L2.I saw her back in May of last year. I saw her in the office/2515. She was hospitalized for a week I read the hospital because of pneumonia. While there she was noted to be bradycardic and her medicines were adjusted. She is currently asymptomatic.  Current Outpatient Prescriptions  Medication Sig Dispense Refill  . acetaminophen (TYLENOL) 325 MG tablet Take 2 tablets (650 mg total) by mouth every 4 (four) hours as needed for pain or fever.      Marland Kitchen atorvastatin (LIPITOR) 40 MG tablet TAKE 1 TABLET DAILY  90 tablet  2  . calcium-vitamin D (OSCAL WITH D) 500-200  MG-UNIT per tablet Take 1 tablet by mouth daily.      . carvedilol (COREG) 3.125 MG tablet Take 3.125 mg by mouth 2 (two) times daily with a meal.      . clopidogrel (PLAVIX) 75 MG tablet Take 75 mg by mouth daily.      . furosemide (LASIX) 20 MG tablet two tablets in the morning and one tablet at night      . gabapentin (NEURONTIN) 100 MG capsule Take 3 capsules (300 mg total) by mouth daily.  90 capsule  6  . insulin glargine (LANTUS) 100 UNIT/ML injection Inject 40 Units into the skin at bedtime.       . insulin lispro (HUMALOG) 100 UNIT/ML injection Inject 2-4 Units into the skin 2 (two) times daily. Inject 3 units with lunch and dinner      . KLOR-CON M20 20 MEQ tablet TAKE 1 TABLET DAILY  90 tablet  1  . levothyroxine (SYNTHROID, LEVOTHROID) 75 MCG tablet Take 75 mcg by mouth daily.      . magnesium oxide (MAG-OX) 400 MG tablet Take 400 mg by mouth daily with supper.      . Multiple Vitamins-Minerals (CENTRUM SILVER PO) Take 1 tablet by mouth daily with supper.      Marland Kitchen oxyCODONE (OXY IR/ROXICODONE) 5 MG immediate release tablet Take 5 mg by mouth every 6 (six) hours as needed for pain.      . pantoprazole (PROTONIX) 40 MG tablet  Take 40 mg by mouth at bedtime.      . promethazine (PHENERGAN) 25 MG tablet Take 25 mg by mouth daily as needed for nausea. Takes 6.25mg  ( 1/4 tab) at home for nausea prn      . nitroGLYCERIN (NITROSTAT) 0.4 MG SL tablet Place 1 tablet (0.4 mg total) under the tongue every 5 (five) minutes x 3 doses as needed for chest pain.  25 tablet  4   No current facility-administered medications for this visit.    Allergies  Allergen Reactions  . Influenza Vaccines Swelling  . Demerol Nausea And Vomiting  . Promethazine Other (See Comments)    Pt. Unsure   Can use 12.5 mg of meds  . Zithromax [Azithromycin] Nausea And Vomiting  . Penicillins Rash    History   Social History  . Marital Status: Widowed    Spouse Name: N/A    Number of Children: N/A  . Years of  Education: N/A   Occupational History  . Not on file.   Social History Main Topics  . Smoking status: Never Smoker   . Smokeless tobacco: Never Used  . Alcohol Use: No  . Drug Use: No  . Sexual Activity: Not Currently   Other Topics Concern  . Not on file   Social History Narrative  . No narrative on file     Review of Systems: General: negative for chills, fever, night sweats or weight changes.  Cardiovascular: negative for chest pain, dyspnea on exertion, edema, orthopnea, palpitations, paroxysmal nocturnal dyspnea or shortness of breath Dermatological: negative for rash Respiratory: negative for cough or wheezing Urologic: negative for hematuria Abdominal: negative for nausea, vomiting, diarrhea, bright red blood per rectum, melena, or hematemesis Neurologic: negative for visual changes, syncope, or dizziness All other systems reviewed and are otherwise negative except as noted above.    Blood pressure 119/67, pulse 70, height 5\' 6"  (1.676 m), weight 136 lb (61.689 kg).  General appearance: alert and no distress Neck: no adenopathy, no carotid bruit, no JVD, supple, symmetrical, trachea midline and thyroid not enlarged, symmetric, no tenderness/mass/nodules Lungs: clear to auscultation bilaterally Heart: regular rate and rhythm, S1, S2 normal, no murmur, click, rub or gallop Extremities: extremities normal, atraumatic, no cyanosis or edema  EKG normal sinus rhythm at 65 with occasional PVCs and left bundle branch block  ASSESSMENT AND PLAN:   Hypertension Controlled on current medications  PAF. (not felt to be a Coumadin candidate) Currently in sinus rhythm with PVCs. She is on aspirin and Plavix. Her digoxin was discontinued and her carvedilol dose reduced because of bradycardia noticed during an admission for pneumonia.  PVD, RFA PTA 2/12 History of right common femoral cutaneous intervention for lifestyle limiting claudication. Check Dopplers in a while but she  is asymptomatic.  Hyperlipemia On statin therapy followed by her PCP  CABG 2000. PCI 2/10 to RI, cath 04/10/12- medical Rx.  History of coronary artery bypass grafting in 2000. She had a non-STEMI 04/10/12 and underwent cardiac catheterization by Dr. Ellouise Newer revealing a patent LIMA to the LAD, patent stent to the ramus branch with preserved ejection fraction. She denies chest pain or shortness of breath.      Lorretta Harp MD FACP,FACC,FAHA, [facility] 04/23/2014 8:47 AM

## 2014-04-23 NOTE — Assessment & Plan Note (Signed)
Controlled on current medications 

## 2014-04-23 NOTE — Assessment & Plan Note (Signed)
On statin therapy followed by her PCP 

## 2014-04-23 NOTE — Assessment & Plan Note (Signed)
History of right common femoral cutaneous intervention for lifestyle limiting claudication. Check Dopplers in a while but she is asymptomatic.

## 2014-04-23 NOTE — Patient Instructions (Signed)
We request that you follow-up in: 6 months with an extender and in 1 year with Dr Berry  You will receive a reminder letter in the mail two months in advance. If you don't receive a letter, please call our office to schedule the follow-up appointment.   

## 2014-04-23 NOTE — Assessment & Plan Note (Signed)
History of coronary artery bypass grafting in 2000. She had a non-STEMI 04/10/12 and underwent cardiac catheterization by Dr. Ellouise Newer revealing a patent LIMA to the LAD, patent stent to the ramus branch with preserved ejection fraction. She denies chest pain or shortness of breath.

## 2014-04-23 NOTE — Assessment & Plan Note (Signed)
Currently in sinus rhythm with PVCs. She is on aspirin and Plavix. Her digoxin was discontinued and her carvedilol dose reduced because of bradycardia noticed during an admission for pneumonia.

## 2014-07-02 IMAGING — CR DG CHEST 2V
2 series · 2 of 2 positions shown · non-contrast
Comparison: 03/13/2013.

CLINICAL DATA: Once of breath.  Follow-up CHF.

CHEST - 2 VIEW

[w chest lat]
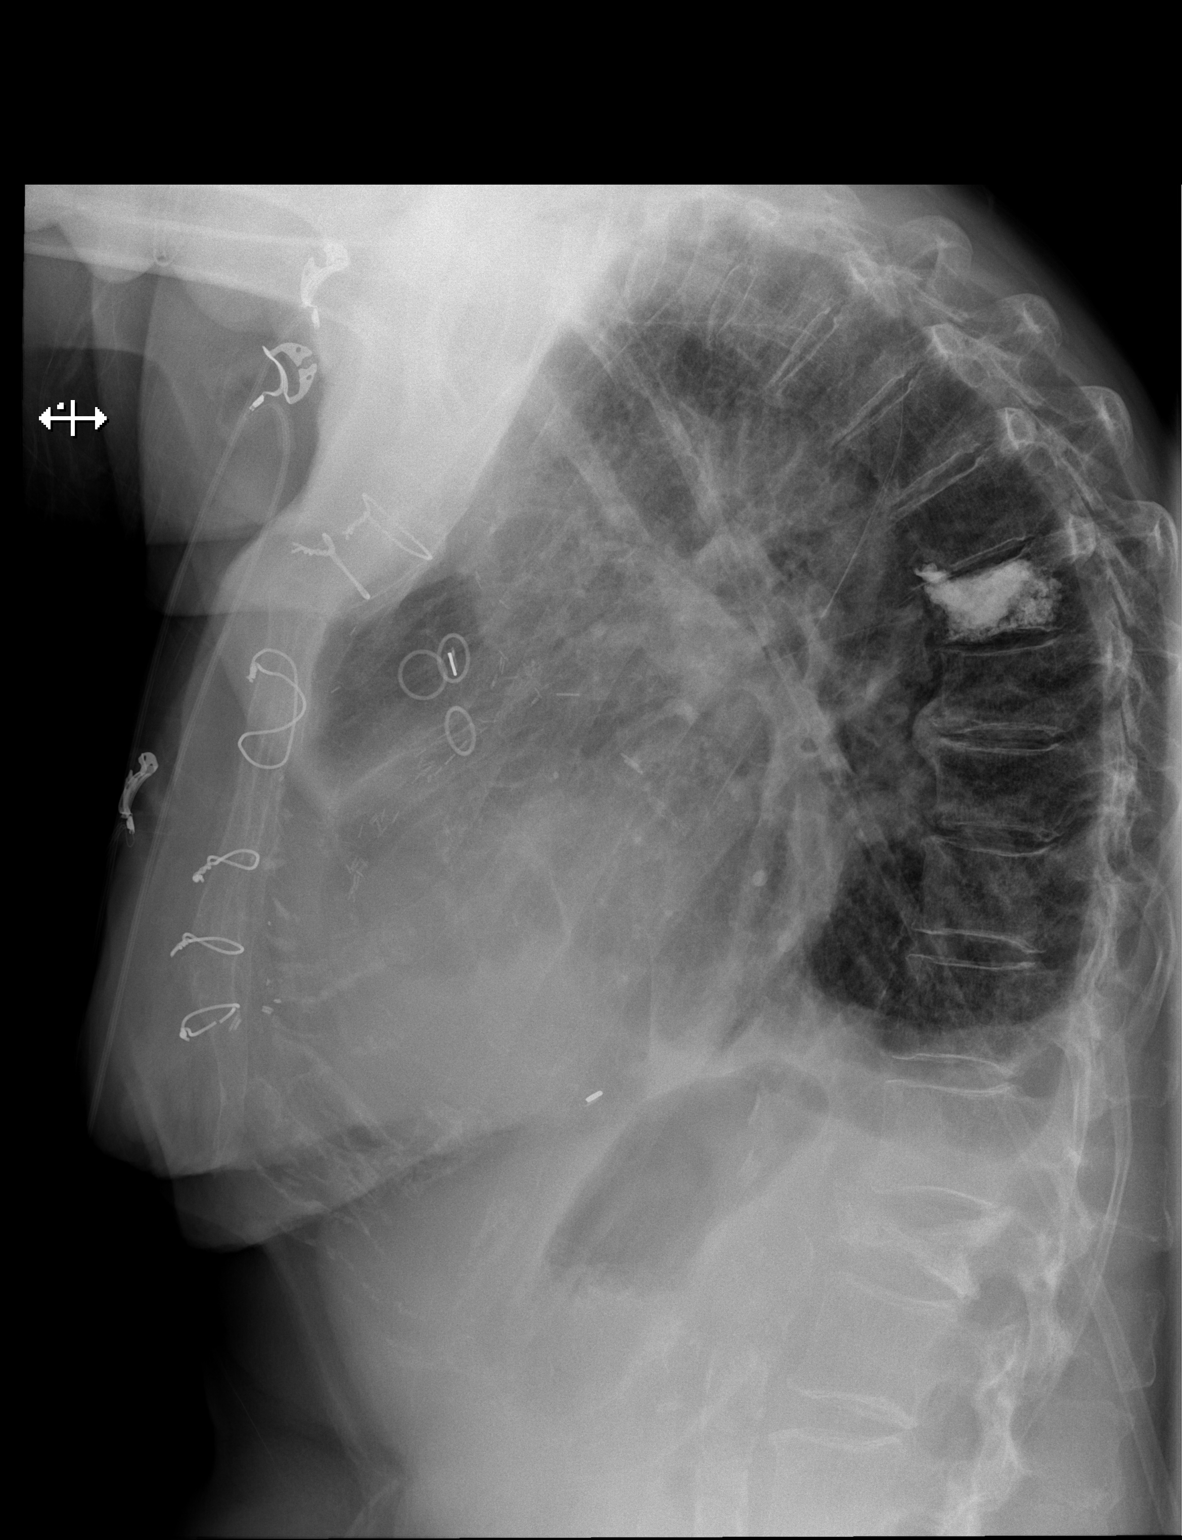

[x chest ap]
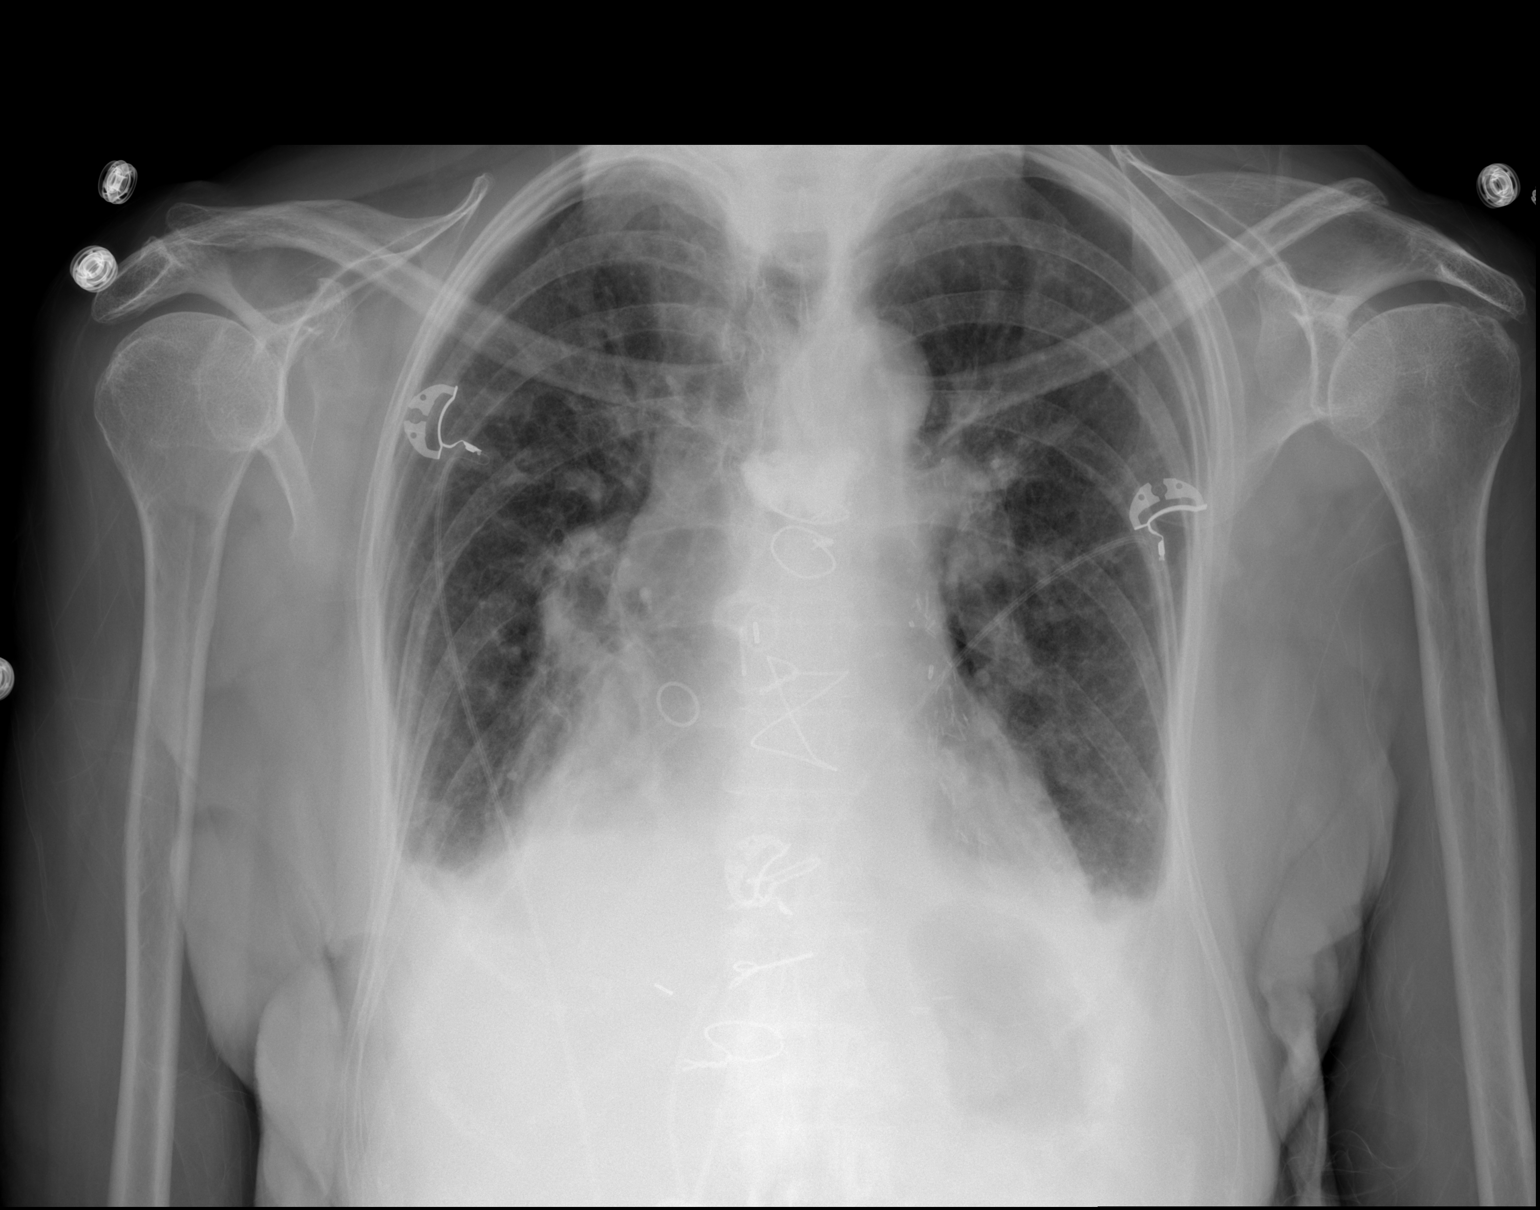

[2 of 2 positions shown; findings below may reference images not displayed]

FINDINGS: Enlarged cardiac silhouette with mild improvement.  Mild
decrease in prominence of the interstitial markings.  Mild decrease
in bilateral pleural fluid.  Stable post CABG changes.  Thoracic
spine degenerative changes and vertebroplasty material.  Associated
mild compression deformity without significant change.  There is
also a stable upper lumbar spine compression deformity.
IMPRESSION: Improving changes of congestive heart failure superimposed on COPD.

## 2014-07-14 ENCOUNTER — Other Ambulatory Visit: Payer: Self-pay | Admitting: Cardiovascular Disease

## 2014-07-15 NOTE — Telephone Encounter (Signed)
Rx was sent to pharmacy electronically. 

## 2014-09-12 ENCOUNTER — Encounter (HOSPITAL_COMMUNITY): Payer: Self-pay | Admitting: Cardiovascular Disease

## 2014-10-03 ENCOUNTER — Other Ambulatory Visit: Payer: Self-pay | Admitting: Cardiovascular Disease

## 2014-10-03 NOTE — Telephone Encounter (Signed)
E sent to pharm 

## 2014-10-31 ENCOUNTER — Encounter: Payer: Self-pay | Admitting: Cardiology

## 2014-10-31 ENCOUNTER — Ambulatory Visit (INDEPENDENT_AMBULATORY_CARE_PROVIDER_SITE_OTHER): Payer: Medicare Other | Admitting: Cardiology

## 2014-10-31 VITALS — BP 102/72 | HR 73 | Ht 66.0 in | Wt 153.9 lb

## 2014-10-31 DIAGNOSIS — Z79899 Other long term (current) drug therapy: Secondary | ICD-10-CM

## 2014-10-31 DIAGNOSIS — R001 Bradycardia, unspecified: Secondary | ICD-10-CM

## 2014-10-31 DIAGNOSIS — N183 Chronic kidney disease, stage 3 unspecified: Secondary | ICD-10-CM

## 2014-10-31 DIAGNOSIS — I480 Paroxysmal atrial fibrillation: Secondary | ICD-10-CM

## 2014-10-31 DIAGNOSIS — I739 Peripheral vascular disease, unspecified: Secondary | ICD-10-CM

## 2014-10-31 DIAGNOSIS — I255 Ischemic cardiomyopathy: Secondary | ICD-10-CM

## 2014-10-31 NOTE — Assessment & Plan Note (Signed)
NSR today 

## 2014-10-31 NOTE — Patient Instructions (Signed)
Your physician wants you to follow-up in: 6 Months with Dr Andria Rhein will receive a reminder letter in the mail two months in advance. If you don't receive a letter, please call our office to schedule the follow-up appointment.  Your physician recommends that you return for lab work BMP, CBC

## 2014-10-31 NOTE — Progress Notes (Signed)
10/31/2014 Catherine Clayton   [birth year]  [id number]  Primary Physician [name] Primary Cardiologist: Dr Gwenlyn Found  HPI:  Pt is a delightful 81 y/o female, followed by Dr. Gwenlyn Found. She has a history of CAD, status post CABG x 4 in 2000. She has known PVOD, status post stenting of her left SFA (which by Dopplers appear occluded) as well as PAF,  LBBB, hypertension, hyperlipidemia, and diabetes. She was last catheterized by Dr. Claiborne Billings in the setting of a non-STEMI on April 10, 2012 and treated medically. She did have a stroke earlier in 2013. It was decided to keep her on aspirin and Plavix as opposed to Coumadin, given her age. She has CRI stage 3 and is not on an ACE. Her last EF had improved to 50 % by echo from [facility] May 2015. She is in the office today for routine check. She lives alone with help a few hours a day. She has been doing well. She denies any falls, chest pain, or problems with her back or legs.     Current Outpatient Prescriptions  Medication Sig Dispense Refill  . acetaminophen (TYLENOL) 325 MG tablet Take 2 tablets (650 mg total) by mouth every 4 (four) hours as needed for pain or fever.    Marland Kitchen atorvastatin (LIPITOR) 40 MG tablet TAKE 1 TABLET DAILY 90 tablet 2  . calcium-vitamin D (OSCAL WITH D) 500-200 MG-UNIT per tablet Take 1 tablet by mouth daily.    . carvedilol (COREG) 3.125 MG tablet Take 3.125 mg by mouth 2 (two) times daily with a meal.    . clopidogrel (PLAVIX) 75 MG tablet Take 75 mg by mouth daily.    . furosemide (LASIX) 20 MG tablet two tablets in the morning and one tablet at night    . gabapentin (NEURONTIN) 100 MG capsule TAKE 3 CAPSULES (300 MG TOTAL) BY MOUTH DAILY. 90 capsule 6  . insulin glargine (LANTUS) 100 UNIT/ML injection Inject 40 Units into the skin every morning.     . insulin lispro (HUMALOG) 100 UNIT/ML injection Inject 2-4 Units into the skin 2 (two) times daily. Inject 3 units with lunch and dinner    . levothyroxine (SYNTHROID,  LEVOTHROID) 75 MCG tablet Take 75 mcg by mouth daily.    . magnesium oxide (MAG-OX) 400 MG tablet Take 400 mg by mouth daily with supper.    . Multiple Vitamins-Minerals (CENTRUM SILVER PO) Take 1 tablet by mouth daily with supper.    . nitroGLYCERIN (NITROSTAT) 0.4 MG SL tablet Place 1 tablet (0.4 mg total) under the tongue every 5 (five) minutes x 3 doses as needed for chest pain. 25 tablet 4  . oxyCODONE (OXY IR/ROXICODONE) 5 MG immediate release tablet Take 5 mg by mouth every 6 (six) hours as needed for pain.    . pantoprazole (PROTONIX) 40 MG tablet Take 40 mg by mouth at bedtime.    . potassium chloride SA (KLOR-CON M20) 20 MEQ tablet Take 1 tablet (20 mEq total) by mouth daily. 90 tablet 2  . promethazine (PHENERGAN) 25 MG tablet Take 25 mg by mouth daily as needed for nausea. Takes 6.25mg  ( 1/4 tab) at home for nausea prn     No current facility-administered medications for this visit.    Allergies  Allergen Reactions  . Influenza Vaccines Swelling  . Ace Inhibitors Other (See Comments)    Renal insufficiency  . Demerol Nausea And Vomiting  . Promethazine Other (See Comments)    Pt. Unsure  Can use 12.5 mg of meds  . Zithromax [Azithromycin] Nausea And Vomiting  . Penicillins Rash    History   Social History  . Marital Status: Widowed    Spouse Name: N/A    Number of Children: N/A  . Years of Education: N/A   Occupational History  . Not on file.   Social History Main Topics  . Smoking status: Never Smoker   . Smokeless tobacco: Never Used  . Alcohol Use: No  . Drug Use: No  . Sexual Activity: Not Currently   Other Topics Concern  . Not on file   Social History Narrative     Review of Systems: General: negative for chills, fever, night sweats or weight changes.  Cardiovascular: negative for chest pain, dyspnea on exertion, edema, orthopnea, palpitations, paroxysmal nocturnal dyspnea or shortness of breath Dermatological: negative for rash Respiratory:  negative for cough or wheezing Urologic: negative for hematuria Abdominal: negative for nausea, vomiting, diarrhea, bright red blood per rectum, melena, or hematemesis Neurologic: negative for visual changes, syncope, or dizziness All other systems reviewed and are otherwise negative except as noted above.    Blood pressure 102/72, pulse 73, height 5\' 6"  (1.676 m), weight 153 lb 14.4 oz (69.809 kg).  General appearance: alert, cooperative and no distress Neck: no carotid bruit and no JVD Lungs: clear to auscultation bilaterally and kyphoscoliosis Heart: regular rate and rhythm  EKG NSR, LBBB, PVCs  ASSESSMENT AND PLAN:   PAF. (not felt to be a Coumadin candidate) NSR today   CKD (chronic kidney disease), stage III Will check labs today   Cardiomyopathy, ischemic Last EF 50% in 2015 at [facility]   CABG 2000. PCI 2/10 to RI, cath 04/10/12- medical Rx.  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   PVD, RFA PTA 2/12 Stable    PLAN  I did order labs today. No medication changes. F/U Dr Gwenlyn Found in 6 months.  [facility] KPA-C 10/31/2014 2:48 PM

## 2014-10-31 NOTE — Assessment & Plan Note (Signed)
Will check labs today

## 2014-10-31 NOTE — Assessment & Plan Note (Signed)
Stable

## 2014-10-31 NOTE — Assessment & Plan Note (Signed)
No significant residual 

## 2014-10-31 NOTE — Assessment & Plan Note (Signed)
Last EF 50% in 2015 at [facility]

## 2014-10-31 NOTE — Assessment & Plan Note (Signed)
Cabg x 4 2000, NSTEMI July 2013- cath, medical Rx

## 2014-11-01 LAB — BASIC METABOLIC PANEL
BUN: 32 mg/dL — ABNORMAL HIGH (ref 6–23)
CO2: 24 mEq/L (ref 19–32)
Calcium: 9.2 mg/dL (ref 8.4–10.5)
Chloride: 101 mEq/L (ref 96–112)
Creat: 2.06 mg/dL — ABNORMAL HIGH (ref 0.50–1.10)
Glucose, Bld: 200 mg/dL — ABNORMAL HIGH (ref 70–99)
Potassium: 4.5 mEq/L (ref 3.5–5.3)
Sodium: 140 mEq/L (ref 135–145)

## 2014-11-01 LAB — CBC
HCT: 41.6 % (ref 36.0–46.0)
Hemoglobin: 14.2 g/dL (ref 12.0–15.0)
MCH: 31.5 pg (ref 26.0–34.0)
MCHC: 34.1 g/dL (ref 30.0–36.0)
MCV: 92.2 fL (ref 78.0–100.0)
MPV: 10.4 fL (ref 8.6–12.4)
Platelets: 158 10*3/uL (ref 150–400)
RBC: 4.51 MIL/uL (ref 3.87–5.11)
RDW: 13.1 % (ref 11.5–15.5)
WBC: 7.6 10*3/uL (ref 4.0–10.5)

## 2014-11-04 ENCOUNTER — Other Ambulatory Visit: Payer: Self-pay | Admitting: Cardiology

## 2014-11-04 DIAGNOSIS — N183 Chronic kidney disease, stage 3 unspecified: Secondary | ICD-10-CM

## 2014-11-06 ENCOUNTER — Other Ambulatory Visit: Payer: Self-pay | Admitting: *Deleted

## 2014-11-06 MED ORDER — FUROSEMIDE 20 MG PO TABS: ORAL_TABLET | ORAL | Status: DC

## 2014-11-16 ENCOUNTER — Other Ambulatory Visit: Payer: Self-pay | Admitting: Cardiovascular Disease

## 2014-11-18 NOTE — Telephone Encounter (Signed)
Rx(s) sent to pharmacy electronically.  

## 2014-11-21 ENCOUNTER — Other Ambulatory Visit: Payer: Self-pay

## 2014-11-21 NOTE — Telephone Encounter (Signed)
Routed to Dr Mitchell Heir, RN for approval

## 2014-11-25 ENCOUNTER — Other Ambulatory Visit: Payer: Self-pay | Admitting: Cardiovascular Disease

## 2014-11-26 MED ORDER — GABAPENTIN 100 MG PO CAPS: 100.0000 mg | ORAL_CAPSULE | Freq: Every day | ORAL | Status: DC

## 2014-11-26 NOTE — Telephone Encounter (Signed)
Pt is wanting to switch pharmacies and wanting to have her gabapentin refilled with [facility].  Thanks

## 2014-11-26 NOTE — Telephone Encounter (Signed)
Medication already refilled.

## 2014-11-26 NOTE — Telephone Encounter (Signed)
[facility].

## 2014-11-27 ENCOUNTER — Other Ambulatory Visit: Payer: Self-pay | Admitting: Cardiovascular Disease

## 2014-11-29 ENCOUNTER — Other Ambulatory Visit: Payer: Self-pay | Admitting: *Deleted

## 2014-11-29 MED ORDER — GABAPENTIN 100 MG PO CAPS: 100.0000 mg | ORAL_CAPSULE | Freq: Every day | ORAL | Status: DC

## 2014-12-03 ENCOUNTER — Other Ambulatory Visit: Payer: Self-pay | Admitting: Cardiovascular Disease

## 2014-12-03 NOTE — Telephone Encounter (Signed)
Rx refill sent to patient pharmacy   

## 2014-12-06 ENCOUNTER — Telehealth: Payer: Self-pay | Admitting: Cardiovascular Disease

## 2014-12-06 ENCOUNTER — Other Ambulatory Visit: Payer: Self-pay | Admitting: Cardiovascular Disease

## 2014-12-06 NOTE — Telephone Encounter (Signed)
ICD 10 code requested by caller. I provided this, no other questions.

## 2014-12-07 LAB — BASIC METABOLIC PANEL
BUN/Creatinine Ratio: 16 (ref 11–26)
BUN: 28 mg/dL — AB (ref 8–27)
CO2: 21 mmol/L (ref 18–29)
CREATININE: 1.8 mg/dL — AB (ref 0.57–1.00)
Calcium: 9.4 mg/dL (ref 8.7–10.3)
Chloride: 102 mmol/L (ref 97–108)
GFR calc Af Amer: 29 mL/min/{1.73_m2} — ABNORMAL LOW (ref >59)
GFR calc non Af Amer: 25 mL/min/{1.73_m2} — ABNORMAL LOW (ref >59)
GLUCOSE: 90 mg/dL (ref 65–99)
Potassium: 4.4 mmol/L (ref 3.5–5.2)
Sodium: 139 mmol/L (ref 134–144)

## 2015-01-15 ENCOUNTER — Telehealth: Payer: Self-pay | Admitting: Cardiovascular Disease

## 2015-01-15 MED ORDER — GABAPENTIN 100 MG PO CAPS: 100.0000 mg | ORAL_CAPSULE | Freq: Three times a day (TID) | ORAL | Status: DC

## 2015-01-15 NOTE — Telephone Encounter (Signed)
Clarified dosing & freq w/ caller. Refill submitted to patient's preferred pharmacy. Caller voiced understanding, no other stated concerns at this time.

## 2015-01-15 NOTE — Telephone Encounter (Signed)
°  1. Which medications need to be refilled? Gabapentin-new prescription  2. Which pharmacy is medication to be sent to?[facility]  3. Do they need a 30 day or 90 day supply? #[id number] and refills  4. Would they like a call back once the medication has been sent to the pharmacy? yes

## 2015-04-07 ENCOUNTER — Other Ambulatory Visit: Payer: Self-pay | Admitting: Cardiovascular Disease

## 2015-04-08 ENCOUNTER — Other Ambulatory Visit: Payer: Self-pay | Admitting: *Deleted

## 2015-04-08 MED ORDER — CARVEDILOL 3.125 MG PO TABS: 3.1250 mg | ORAL_TABLET | Freq: Two times a day (BID) | ORAL | Status: DC

## 2015-04-08 NOTE — Telephone Encounter (Signed)
Rx(s) sent to pharmacy electronically.  

## 2015-04-08 NOTE — Telephone Encounter (Signed)
REFILL 

## 2015-04-10 ENCOUNTER — Other Ambulatory Visit: Payer: Self-pay | Admitting: *Deleted

## 2015-04-10 MED ORDER — CARVEDILOL 3.125 MG PO TABS: 3.1250 mg | ORAL_TABLET | Freq: Two times a day (BID) | ORAL | Status: DC

## 2015-05-13 ENCOUNTER — Ambulatory Visit (INDEPENDENT_AMBULATORY_CARE_PROVIDER_SITE_OTHER): Payer: Medicare Other | Admitting: Cardiovascular Disease

## 2015-05-13 ENCOUNTER — Encounter: Payer: Self-pay | Admitting: Cardiovascular Disease

## 2015-05-13 VITALS — BP 144/76 | HR 70 | Ht 66.0 in | Wt 155.0 lb

## 2015-05-13 DIAGNOSIS — I739 Peripheral vascular disease, unspecified: Secondary | ICD-10-CM | POA: Diagnosis not present

## 2015-05-13 DIAGNOSIS — E785 Hyperlipidemia, unspecified: Secondary | ICD-10-CM | POA: Diagnosis not present

## 2015-05-13 DIAGNOSIS — I447 Left bundle-branch block, unspecified: Secondary | ICD-10-CM | POA: Diagnosis not present

## 2015-05-13 DIAGNOSIS — I10 Essential (primary) hypertension: Secondary | ICD-10-CM

## 2015-05-13 DIAGNOSIS — I2583 Coronary atherosclerosis due to lipid rich plaque: Secondary | ICD-10-CM

## 2015-05-13 DIAGNOSIS — I255 Ischemic cardiomyopathy: Secondary | ICD-10-CM

## 2015-05-13 DIAGNOSIS — I2510 Atherosclerotic heart disease of native coronary artery without angina pectoris: Secondary | ICD-10-CM

## 2015-05-13 NOTE — Assessment & Plan Note (Signed)
History of hyperlipidemia on atorvastatin 40 mg a day followed by her PCP

## 2015-05-13 NOTE — Assessment & Plan Note (Signed)
History of CAD status post coronary coronary artery bypass grafting in 2000. She was catheterized by Dr. Ellouise Newer in the setting of a non-STEMI 04/10/12 revealing a patent vein to the RCA, patent LIMA to occluded LAD, patent stent to the ramus branch with an EF of 50 at 55%, and apical hypokinesia. She denies chest pain or shortness of breath.

## 2015-05-13 NOTE — Progress Notes (Signed)
05/13/2015 [place]   September 17, 1925  [id number]  Primary Physician [name] Primary Cardiologist: Lorretta Harp MD Renae Gloss   HPI:  The patient is a very pleasant 81 year old thin and frail-appearing widowed Caucasian female, mother of 2 and grandmother to 89 grandchildren, who is accompanied by one of her daughters today. I last saw her 69months ago. She saw Lurena Joiner for a back injury January of this year.  She has a history of CAD, status post coronary artery bypass grafting in 2000. She has known PVOD, status post stenting of her left SFA which by Dopplers appear occluded, as well as hypertension, hyperlipidemia, and diabetes. She has had colon cancer in the past treated with chemotherapy and radiation therapy. She was catheterized by Dr. Ellouise Newer in the setting of a non-STEMI on April 10, 2012, revealing patent veins to an RCA, patent LIMA to an occluded LAD, and a patent stent to a ramus branch with an EF of 50% to 55% and apical hypokinesia. I intervened on her right common femoral artery January 26, 2011, at which time I demonstrated an occluded left SFA secondary to "in-stent restenosis." Outpatient [facility] has shown some PAF as well as PVCs which were apparently conducted. She did have a stroke earlier last year and it was decided to keep her on aspirin and Plavix as opposed to Coumadin given her age. She currently complains of back pain and has a fractured L2.I saw her back in May of last year.  Since I saw her in the office year ago she's remained critically stable. She denies chest pain, shortness of breath or claudication.   Current Outpatient Prescriptions  Medication Sig Dispense Refill  . acetaminophen (TYLENOL) 325 MG tablet Take 2 tablets (650 mg total) by mouth every 4 (four) hours as needed for pain or fever.    Marland Kitchen atorvastatin (LIPITOR) 40 MG tablet Take 1 tablet (40 mg total) by mouth daily. 90 tablet 1  . calcium-vitamin D (OSCAL WITH D) 500-200 MG-UNIT  per tablet Take 1 tablet by mouth daily.    . carvedilol (COREG) 3.125 MG tablet Take 1 tablet (3.125 mg total) by mouth 2 (two) times daily with a meal. KEEP OV. 180 tablet 1  . clopidogrel (PLAVIX) 75 MG tablet Take 75 mg by mouth daily.    . furosemide (LASIX) 20 MG tablet Two tablets in the morning 30 tablet   . gabapentin (NEURONTIN) 100 MG capsule Take 1 capsule (100 mg total) by mouth 3 (three) times daily. 270 capsule 1  . insulin glargine (LANTUS) 100 UNIT/ML injection Inject 30 Units into the skin every morning.     . insulin lispro (HUMALOG) 100 UNIT/ML injection Inject 2-4 Units into the skin 2 (two) times daily. Inject 3 units with lunch and dinner    . levothyroxine (SYNTHROID, LEVOTHROID) 75 MCG tablet Take 75 mcg by mouth daily.    . magnesium oxide (MAG-OX) 400 MG tablet Take 400 mg by mouth daily with supper.    . Multiple Vitamins-Minerals (CENTRUM SILVER PO) Take 1 tablet by mouth daily with supper.    Marland Kitchen oxyCODONE (OXY IR/ROXICODONE) 5 MG immediate release tablet Take 5 mg by mouth every 6 (six) hours as needed for pain.    . pantoprazole (PROTONIX) 40 MG tablet Take 40 mg by mouth at bedtime.    . potassium chloride SA (KLOR-CON M20) 20 MEQ tablet Take 1 tablet (20 mEq total) by mouth daily. 90 tablet 2  . promethazine (PHENERGAN)  25 MG tablet Take 25 mg by mouth daily as needed for nausea. Takes 6.25mg  ( 1/4 tab) at home for nausea prn    . nitroGLYCERIN (NITROSTAT) 0.4 MG SL tablet Place 1 tablet (0.4 mg total) under the tongue every 5 (five) minutes x 3 doses as needed for chest pain. 25 tablet 4   No current facility-administered medications for this visit.    Allergies  Allergen Reactions  . Influenza Vaccines Swelling  . Ace Inhibitors Other (See Comments)    Renal insufficiency  . Demerol Nausea And Vomiting  . Zithromax [Azithromycin] Nausea And Vomiting  . Penicillins Rash    History   Social History  . Marital Status: Widowed    Spouse Name: N/A  .  Number of Children: N/A  . Years of Education: N/A   Occupational History  . Not on file.   Social History Main Topics  . Smoking status: Never Smoker   . Smokeless tobacco: Never Used  . Alcohol Use: No  . Drug Use: No  . Sexual Activity: Not Currently   Other Topics Concern  . Not on file   Social History Narrative     Review of Systems: General: negative for chills, fever, night sweats or weight changes.  Cardiovascular: negative for chest pain, dyspnea on exertion, edema, orthopnea, palpitations, paroxysmal nocturnal dyspnea or shortness of breath Dermatological: negative for rash Respiratory: negative for cough or wheezing Urologic: negative for hematuria Abdominal: negative for nausea, vomiting, diarrhea, bright red blood per rectum, melena, or hematemesis Neurologic: negative for visual changes, syncope, or dizziness All other systems reviewed and are otherwise negative except as noted above.    Blood pressure 144/76, pulse 70, height 5\' 6"  (1.676 m), weight 155 lb (70.308 kg).  General appearance: alert and no distress Neck: no adenopathy, no carotid bruit, no JVD, supple, symmetrical, trachea midline and thyroid not enlarged, symmetric, no tenderness/mass/nodules Lungs: clear to auscultation bilaterally Heart: regular rate and rhythm, S1, S2 normal, no murmur, click, rub or gallop Extremities: extremities normal, atraumatic, no cyanosis or edema  EKG /70 with left bundle branch block and occasional PVCs. I personally reviewed this EKG  ASSESSMENT AND PLAN:   PVD, RFA PTA 2/12 History of peripheral vascular disease status post right common femoral artery intervention which I performed 01/26/11. At that time it demonstrated an occluded left SFA secondary to "in-stent restenosis. She denies claudication.  LBBB (left bundle branch block) chronic  Hypertension History of hypertension blood pressure measured at 144/76. She is on carvedilol. Continue current meds at  current dosing  Hyperlipemia History of hyperlipidemia on atorvastatin 40 mg a day followed by her PCP  CABG 2000. PCI 2/10 to RI, cath 04/10/12- medical Rx.  History of CAD status post coronary coronary artery bypass grafting in 2000. She was catheterized by Dr. Ellouise Newer in the setting of a non-STEMI 04/10/12 revealing a patent vein to the RCA, patent LIMA to occluded LAD, patent stent to the ramus branch with an EF of 50 at 55%, and apical hypokinesia. She denies chest pain or shortness of breath.      Lorretta Harp MD FACP,FACC,FAHA, [facility] 05/13/2015 3:25 PM

## 2015-05-13 NOTE — Assessment & Plan Note (Signed)
chronic

## 2015-05-13 NOTE — Assessment & Plan Note (Signed)
History of peripheral vascular disease status post right common femoral artery intervention which I performed 01/26/11. At that time it demonstrated an occluded left SFA secondary to "in-stent restenosis. She denies claudication.

## 2015-05-13 NOTE — Assessment & Plan Note (Signed)
History of hypertension blood pressure measured at 144/76. She is on carvedilol. Continue current meds at current dosing

## 2015-05-13 NOTE — Patient Instructions (Signed)
Your physician wants you to follow-up in: 1 year with Dr Berry. You will receive a reminder letter in the mail two months in advance. If you don't receive a letter, please call our office to schedule the follow-up appointment.  

## 2015-05-16 ENCOUNTER — Other Ambulatory Visit: Payer: Self-pay | Admitting: Cardiovascular Disease

## 2015-05-16 NOTE — Telephone Encounter (Signed)
Rx(s) sent to pharmacy electronically.  

## 2015-06-25 ENCOUNTER — Other Ambulatory Visit: Payer: Self-pay | Admitting: Cardiovascular Disease

## 2015-09-29 ENCOUNTER — Other Ambulatory Visit: Payer: Self-pay | Admitting: Cardiovascular Disease

## 2015-11-03 ENCOUNTER — Other Ambulatory Visit: Payer: Self-pay | Admitting: Cardiovascular Disease

## 2015-11-03 NOTE — Telephone Encounter (Signed)
Rx request sent to pharmacy.  

## 2015-12-24 ENCOUNTER — Ambulatory Visit (INDEPENDENT_AMBULATORY_CARE_PROVIDER_SITE_OTHER): Payer: Medicare Other | Admitting: Cardiology

## 2015-12-24 ENCOUNTER — Encounter: Payer: Self-pay | Admitting: Cardiology

## 2015-12-24 VITALS — BP 148/76 | HR 67 | Ht 66.0 in | Wt 153.5 lb

## 2015-12-24 DIAGNOSIS — Z951 Presence of aortocoronary bypass graft: Secondary | ICD-10-CM | POA: Diagnosis not present

## 2015-12-24 DIAGNOSIS — N183 Chronic kidney disease, stage 3 unspecified: Secondary | ICD-10-CM

## 2015-12-24 DIAGNOSIS — I480 Paroxysmal atrial fibrillation: Secondary | ICD-10-CM

## 2015-12-24 DIAGNOSIS — I255 Ischemic cardiomyopathy: Secondary | ICD-10-CM

## 2015-12-24 DIAGNOSIS — R600 Localized edema: Secondary | ICD-10-CM

## 2015-12-24 NOTE — Assessment & Plan Note (Addendum)
Cabg x 4 in 2000, cath July 2013- medical Rx No angina

## 2015-12-24 NOTE — Assessment & Plan Note (Signed)
R/O DVT

## 2015-12-24 NOTE — Assessment & Plan Note (Signed)
EF 50% by echo at [facility] May 2015

## 2015-12-24 NOTE — Assessment & Plan Note (Signed)
R femoral artery stent

## 2015-12-24 NOTE — Patient Instructions (Addendum)
Medication Instructions:  Your physician recommends that you continue on your current medications as directed. Please refer to the Current Medication list given to you today.  Labwork: NONE  Testing/Procedures: Your physician has requested that you have a lower or upper extremity venous duplex. This test is an ultrasound of the veins in the legs or arms. It looks at venous blood flow that carries blood from the heart to the legs or arms. Allow one hour for a Lower Venous exam. Allow thirty minutes for an Upper Venous exam. There are no restrictions or special instructions. LOWER EXTREMITY LEFT LEG  Follow-Up: Your physician wants you to follow-up in: [place] will receive a reminder letter in the mail two months in advance. If you don't receive a letter, please call our office to schedule the follow-up appointment.  If you need a refill on your cardiac medications before your next appointment, please call your pharmacy.

## 2015-12-24 NOTE — Assessment & Plan Note (Addendum)
NSR with occasional PVC, IVCD-rate 67 CHADs VASc= 9

## 2015-12-24 NOTE — Progress Notes (Signed)
12/24/2015 Catherine Clayton   June 24, 1925  [id number]  Primary Physician [name] Primary Cardiologist: Dr Gwenlyn Found  HPI:  Delightful 82 y/o female, followed by Dr. Gwenlyn Found. She has a history of CAD, status post CABG x 4 in 2000. She has known PVOD, status post stenting of her left SFA (which by Dopplers appear occluded) as well as PAF, LBBB, hypertension, hyperlipidemia, and diabetes. She was last catheterized by Dr. Claiborne Billings in the setting of a non-STEMI on April 10, 2012 and treated medically. She did have a stroke earlier in 2013. It was decided to keep her on aspirin and Plavix as opposed to Coumadin, given her age. She has CRI stage 3 and is not on an ACE. Her last EF had improved to 50 % by echo from [facility] May 2015. She is in the office today for routine check. She lives alone with help a few hours a day. She uses a rolling walker. Her daughter accompanied her. She was admitted in Dec 2016 for CAP and CHF. It took awhile to recover but she has felt well lately. Her only complaint is swelling in her Lt leg (venectomy leg). This has been a chronic issue but seems to be worse lately. She denies any dyspnea or chest pain.     Current Outpatient Prescriptions  Medication Sig Dispense Refill  . acetaminophen (TYLENOL) 325 MG tablet Take 2 tablets (650 mg total) by mouth every 4 (four) hours as needed for pain or fever.    Marland Kitchen atorvastatin (LIPITOR) 40 MG tablet Take 1 tablet (40 mg total) by mouth daily. 90 tablet 3  . calcium-vitamin D (OSCAL WITH D) 500-200 MG-UNIT per tablet Take 1 tablet by mouth daily.    . carvedilol (COREG) 3.125 MG tablet Take 1 tablet (3.125 mg total) by mouth 2 (two) times daily with a meal. KEEP OV. 180 tablet 1  . clopidogrel (PLAVIX) 75 MG tablet Take 75 mg by mouth daily.    . furosemide (LASIX) 20 MG tablet Two tablets in the morning 30 tablet   . gabapentin (NEURONTIN) 100 MG capsule TAKE 1 CAPSULE THREE TIMES A DAY 270 capsule 3  . insulin glargine  (LANTUS) 100 UNIT/ML injection Inject 30 Units into the skin every morning.     . insulin lispro (HUMALOG) 100 UNIT/ML injection Inject 2-4 Units into the skin 2 (two) times daily. Inject 3 units with lunch and dinner    . levothyroxine (SYNTHROID, LEVOTHROID) 75 MCG tablet Take 75 mcg by mouth daily.    . Multiple Vitamins-Minerals (CENTRUM SILVER PO) Take 1 tablet by mouth daily with supper.    . Multiple Vitamins-Minerals (PRESERVISION AREDS 2 PO) Take 1 tablet by mouth 2 (two) times daily.    Marland Kitchen oxyCODONE (OXY IR/ROXICODONE) 5 MG immediate release tablet Take 5 mg by mouth every 6 (six) hours as needed for pain.    . pantoprazole (PROTONIX) 40 MG tablet Take 40 mg by mouth at bedtime.    . potassium chloride SA (KLOR-CON M20) 20 MEQ tablet Take 10 mEq by mouth daily.    . promethazine (PHENERGAN) 25 MG tablet Take 25 mg by mouth daily as needed for nausea. Takes 6.25mg  ( 1/4 tab) at home for nausea prn    . Psyllium (METAMUCIL PO) Take 1 tablet by mouth daily.    . nitroGLYCERIN (NITROSTAT) 0.4 MG SL tablet Place 1 tablet (0.4 mg total) under the tongue every 5 (five) minutes x 3 doses as needed for chest pain. 25  tablet 4   No current facility-administered medications for this visit.    Allergies  Allergen Reactions  . Influenza Vaccines Swelling  . Ace Inhibitors Other (See Comments)    Renal insufficiency  . Demerol Nausea And Vomiting  . Zithromax [Azithromycin] Nausea And Vomiting  . Penicillins Rash    Social History   Social History  . Marital Status: Widowed    Spouse Name: N/A  . Number of Children: N/A  . Years of Education: N/A   Occupational History  . Not on file.   Social History Main Topics  . Smoking status: Never Smoker   . Smokeless tobacco: Never Used  . Alcohol Use: No  . Drug Use: No  . Sexual Activity: Not Currently   Other Topics Concern  . Not on file   Social History Narrative     Review of Systems: General: negative for chills, fever,  night sweats or weight changes.  Cardiovascular: negative for chest pain, dyspnea on exertion, edema, orthopnea, palpitations, paroxysmal nocturnal dyspnea or shortness of breath Dermatological: negative for rash Respiratory: negative for cough or wheezing Urologic: negative for hematuria Abdominal: negative for nausea, vomiting, diarrhea, bright red blood per rectum, melena, or hematemesis Neurologic: negative for visual changes, syncope, or dizziness All other systems reviewed and are otherwise negative except as noted above.    Blood pressure 148/76, pulse 67, height 5\' 6"  (1.676 m), weight 153 lb 8 oz (69.627 kg).  General appearance: alert, cooperative and no distress Neck: no carotid bruit and no JVD Lungs: kyphosis, clear Heart: regular rate and rhythm Extremities: 1-2+ LLE edema, none on Rt Skin: Skin color, texture, turgor normal. No rashes or lesions Neurologic: Grossly normal  EKG NSR, PVC, LBBB  ASSESSMENT AND PLAN:   Edema of left lower extremity R/O DVT  PAF. (not felt to be a Coumadin candidate) NSR with occasional PVC, IVCD-rate 67 CHADs VASc= 9  CKD (chronic kidney disease), stage III Labs from PCP- SCr 1.65  Hx of CABG Cabg x 4 in 2000, cath July 2013- medical Rx No angina  PVD, RFA PTA 2/12 R femoral artery stent  Cardiomyopathy, ischemic EF 50% by echo at [facility] May 2015    PLAN  I think we should check a venous doppler of her LLE to r/o DVT. She was not felt to be a candidate for long term anticoagulation but we could consider short term- 6 months- if she has a DVT.   Kerin Ransom K PA-C 12/24/2015 4:44 PM

## 2015-12-24 NOTE — Assessment & Plan Note (Signed)
Labs from PCP- SCr 1.65

## 2015-12-26 ENCOUNTER — Telehealth: Payer: Self-pay | Admitting: Cardiology

## 2015-12-26 NOTE — Telephone Encounter (Signed)
Catherine Clayton is calling in because the pt is scheduled to have a Venous doppler with [place] on 3/29 but she is having transportation issues and [facility] has an opening for 3/27 , they just need Lurena Joiner to place the order for the [place] facility.   Thanks

## 2015-12-26 NOTE — Telephone Encounter (Signed)
Received a call from [facility] and she saw patient today Patient has doppler scheduled at [facility] office 12/31/15 She did call [facility] which is closer and more convenient, they have an availability on Monday 3/27 Called and scheduled patient for Monday 3/27 arrive at 10:30 for 11:00 Faxed order to [phone] Advised patient, verbalized understanding

## 2015-12-31 ENCOUNTER — Inpatient Hospital Stay (HOSPITAL_COMMUNITY): Admission: RE | Admit: 2015-12-31 | Payer: Medicare Other | Source: Ambulatory Visit

## 2015-12-31 ENCOUNTER — Encounter (HOSPITAL_COMMUNITY): Payer: Medicare Other

## 2016-01-05 ENCOUNTER — Telehealth: Payer: Self-pay | Admitting: Cardiovascular Disease

## 2016-01-05 NOTE — Telephone Encounter (Signed)
New message   Pt daughter is calling for results of carotid

## 2016-01-05 NOTE — Telephone Encounter (Signed)
Line busy when dialed. 

## 2016-01-06 NOTE — Telephone Encounter (Signed)
Unable to reach dtr, fast busy. Spoke with pt, she had dopplers done at [facility] and has not heard the results. Will call and gets results faxed.

## 2016-01-06 NOTE — Telephone Encounter (Signed)
Pt's daughter is calling back to get the results from the pt's DVT doppler that was done on 3/29. Please f/u with her  Thanks

## 2016-01-06 NOTE — Telephone Encounter (Signed)
Dopplers from [facility] received and reviewed, no DVT. Spoke with pt dtr, aware of results. She reports the leg cont to be swollen and wonders if there is anything else that needs to be done. Explained to keep leg elevated and to watch salt intake. ? Compression hose, will discuss with luke kilroy pa

## 2016-01-06 NOTE — Telephone Encounter (Signed)
Spoke with pt dtr, Catherine Clayton agrees with compression hose. Pt given directions to elastic therapy in [place].

## 2016-01-13 ENCOUNTER — Encounter: Payer: Self-pay | Admitting: Cardiology

## 2016-05-19 ENCOUNTER — Ambulatory Visit (INDEPENDENT_AMBULATORY_CARE_PROVIDER_SITE_OTHER): Payer: Medicare Other | Admitting: Cardiovascular Disease

## 2016-05-19 ENCOUNTER — Encounter: Payer: Self-pay | Admitting: Cardiovascular Disease

## 2016-05-19 ENCOUNTER — Encounter (INDEPENDENT_AMBULATORY_CARE_PROVIDER_SITE_OTHER): Payer: Self-pay

## 2016-05-19 DIAGNOSIS — I739 Peripheral vascular disease, unspecified: Secondary | ICD-10-CM | POA: Diagnosis not present

## 2016-05-19 DIAGNOSIS — I480 Paroxysmal atrial fibrillation: Secondary | ICD-10-CM

## 2016-05-19 DIAGNOSIS — E785 Hyperlipidemia, unspecified: Secondary | ICD-10-CM | POA: Diagnosis not present

## 2016-05-19 DIAGNOSIS — I10 Essential (primary) hypertension: Secondary | ICD-10-CM | POA: Diagnosis not present

## 2016-05-19 DIAGNOSIS — Z951 Presence of aortocoronary bypass graft: Secondary | ICD-10-CM | POA: Diagnosis not present

## 2016-05-19 DIAGNOSIS — I255 Ischemic cardiomyopathy: Secondary | ICD-10-CM | POA: Diagnosis not present

## 2016-05-19 NOTE — Progress Notes (Signed)
05/19/2016 [place]   [birth year]  [id number]  Primary Physician [name] (Inactive) Primary Cardiologist: Lorretta Harp MD Lupe Carney, [place]  HPI:  The patient is a very pleasant 82 year old thin and frail-appearing widowed Caucasian female, mother of 2 and grandmother to 6 grandchildren, who is accompanied by one of her daughters today. I last saw her 05/13/15. She saw Lurena Joiner for a back injury January of this year.  She has a history of CAD, status post coronary artery bypass grafting in 2000. She has known PVOD, status post stenting of her left SFA which by Dopplers appear occluded, as well as hypertension, hyperlipidemia, and diabetes. She has had colon cancer in the past treated with chemotherapy and radiation therapy. She was catheterized by Dr. Ellouise Newer in the setting of a non-STEMI on April 10, 2012, revealing patent veins to an RCA, patent LIMA to an occluded LAD, and a patent stent to a ramus branch with an EF of 50% to 55% and apical hypokinesia. I intervened on her right common femoral artery January 26, 2011, at which time I demonstrated an occluded left SFA secondary to "in-stent restenosis." Outpatient [facility] has shown some PAF as well as PVCs which were apparently conducted. She did have a stroke earlier last year and it was decided to keep her on aspirin and Plavix as opposed to Coumadin given her age. She currently complains of back pain and has a fractured L2.I saw her back in May of last year.  Since I saw her in the office year ago she's remained critically stable. She denies chest pain, shortness of breath or claudication.  Current Outpatient Prescriptions  Medication Sig Dispense Refill  . acetaminophen (TYLENOL) 325 MG tablet Take 2 tablets (650 mg total) by mouth every 4 (four) hours as needed for pain or fever.    Marland Kitchen atorvastatin (LIPITOR) 40 MG tablet Take 1 tablet (40 mg total) by mouth daily. 90 tablet 3  . calcium-vitamin D (OSCAL WITH D) 500-200  MG-UNIT per tablet Take 1 tablet by mouth daily.    . carvedilol (COREG) 3.125 MG tablet Take 1 tablet (3.125 mg total) by mouth 2 (two) times daily with a meal. KEEP OV. 180 tablet 1  . clopidogrel (PLAVIX) 75 MG tablet Take 75 mg by mouth daily.    . furosemide (LASIX) 20 MG tablet Take 40 mg by mouth daily.    Marland Kitchen gabapentin (NEURONTIN) 100 MG capsule Take 100 mg by mouth 3 (three) times daily.    . insulin glargine (LANTUS) 100 UNIT/ML injection Inject 30 Units into the skin every morning.     . insulin lispro (HUMALOG) 100 UNIT/ML injection Inject 2-4 Units into the skin 2 (two) times daily. Inject 3 units with lunch and dinner    . levothyroxine (SYNTHROID, LEVOTHROID) 75 MCG tablet Take 75 mcg by mouth daily.    . Multiple Vitamins-Minerals (CENTRUM SILVER PO) Take 1 tablet by mouth daily with supper.    . Multiple Vitamins-Minerals (PRESERVISION AREDS 2 PO) Take 1 tablet by mouth 2 (two) times daily.    Marland Kitchen oxyCODONE (OXY IR/ROXICODONE) 5 MG immediate release tablet Take 5 mg by mouth every 6 (six) hours as needed for pain.    . pantoprazole (PROTONIX) 40 MG tablet Take 40 mg by mouth at bedtime.    . potassium chloride SA (KLOR-CON M20) 20 MEQ tablet Take 10 mEq by mouth daily.    . promethazine (PHENERGAN) 25 MG tablet Take 25 mg by  mouth daily as needed for nausea. Takes 6.25mg  ( 1/4 tab) at home for nausea prn    . Psyllium (METAMUCIL PO) Take 1 tablet by mouth daily.    . nitroGLYCERIN (NITROSTAT) 0.4 MG SL tablet Place 1 tablet (0.4 mg total) under the tongue every 5 (five) minutes x 3 doses as needed for chest pain. 25 tablet 4   No current facility-administered medications for this visit.     Allergies  Allergen Reactions  . Influenza Vaccines Swelling  . Ace Inhibitors Other (See Comments)    Renal insufficiency  . Demerol Nausea And Vomiting  . Zithromax [Azithromycin] Nausea And Vomiting  . Penicillins Rash    Social History   Social History  . Marital status: Widowed      Spouse name: N/A  . Number of children: N/A  . Years of education: N/A   Occupational History  . Not on file.   Social History Main Topics  . Smoking status: Never Smoker  . Smokeless tobacco: Never Used  . Alcohol use No  . Drug use: No  . Sexual activity: Not Currently   Other Topics Concern  . Not on file   Social History Narrative  . No narrative on file     Review of Systems: General: negative for chills, fever, night sweats or weight changes.  Cardiovascular: negative for chest pain, dyspnea on exertion, edema, orthopnea, palpitations, paroxysmal nocturnal dyspnea or shortness of breath Dermatological: negative for rash Respiratory: negative for cough or wheezing Urologic: negative for hematuria Abdominal: negative for nausea, vomiting, diarrhea, bright red blood per rectum, melena, or hematemesis Neurologic: negative for visual changes, syncope, or dizziness All other systems reviewed and are otherwise negative except as noted above.    Blood pressure 116/64, pulse 61, height 5\' 6"  (1.676 m), weight 155 lb (70.3 kg).  General appearance: alert and no distress Neck: no adenopathy, no carotid bruit, no JVD, supple, symmetrical, trachea midline and thyroid not enlarged, symmetric, no tenderness/mass/nodules Lungs: clear to auscultation bilaterally Heart: regular rate and rhythm, S1, S2 normal, no murmur, click, rub or gallop Extremities: extremities normal, atraumatic, no cyanosis or edema  EKG not performed today  ASSESSMENT AND PLAN:   Hypertension History of hypertension with blood pressure measured at 116/64. She is on carvedilol. Continue current meds (  PAF. (not felt to be a Coumadin candidate) History of paroxysmal mitral fibrillation on aspirin and Plavix. We decided not to begin her on oral" due to her age and unsteadiness on her feet.  PVD, RFA PTA 2/12 History of peripheral arterial disease status post stenting of her left SFA in the past. I  intervened on her right common femoral artery 01/26/11 at which time demonstrated an occluded left SFA secondary to "in-stent restenosis". She really denies claudication at this point.  Hyperlipemia History of hyperlipidemia on statin therapy followed by her PCP  Hx of CABG History of coronary artery disease status post coronary artery bypass grafting in 2000. She had a non-STEMI 04/10/12 and underwent cardiac catheterization by Dr. Claiborne Billings revealing patent vein to the RCA, LIMA to occluded LAD and patent stent to ramus branch with an EF of 50-55% with apical hypokinesia.      Lorretta Harp MD FACP,FACC,FAHA, [facility] 05/19/2016 4:27 PM

## 2016-05-19 NOTE — Assessment & Plan Note (Signed)
History of hyperlipidemia on statin therapy followed by her PCP. 

## 2016-05-19 NOTE — Patient Instructions (Signed)

## 2016-05-19 NOTE — Assessment & Plan Note (Signed)
History of paroxysmal mitral fibrillation on aspirin and Plavix. We decided not to begin her on oral" due to her age and unsteadiness on her feet.

## 2016-05-19 NOTE — Assessment & Plan Note (Signed)
History of peripheral arterial disease status post stenting of her left SFA in the past. I intervened on her right common femoral artery 01/26/11 at which time demonstrated an occluded left SFA secondary to "in-stent restenosis". She really denies claudication at this point.

## 2016-05-19 NOTE — Assessment & Plan Note (Signed)
History of hypertension with blood pressure measured at 116/64. She is on carvedilol. Continue current meds (

## 2016-05-19 NOTE — Assessment & Plan Note (Signed)
History of coronary artery disease status post coronary artery bypass grafting in 2000. She had a non-STEMI 04/10/12 and underwent cardiac catheterization by Dr. Claiborne Billings revealing patent vein to the RCA, LIMA to occluded LAD and patent stent to ramus branch with an EF of 50-55% with apical hypokinesia.

## 2016-07-18 ENCOUNTER — Other Ambulatory Visit: Payer: Self-pay | Admitting: Cardiovascular Disease

## 2016-09-14 ENCOUNTER — Other Ambulatory Visit: Payer: Self-pay | Admitting: Cardiovascular Disease

## 2016-09-24 ENCOUNTER — Telehealth: Payer: Self-pay | Admitting: Cardiovascular Disease

## 2016-09-24 ENCOUNTER — Other Ambulatory Visit: Payer: Self-pay

## 2016-09-24 MED ORDER — FUROSEMIDE 20 MG PO TABS: 40.0000 mg | ORAL_TABLET | Freq: Every day | ORAL | 2 refills | Status: AC

## 2016-09-24 NOTE — Telephone Encounter (Signed)
New message  1. furosemide 20mg  40 mg daily 2. [facility] 3. 90 day

## 2016-10-25 ENCOUNTER — Other Ambulatory Visit: Payer: Self-pay | Admitting: Cardiovascular Disease

## 2016-12-29 DIAGNOSIS — R112 Nausea with vomiting, unspecified: Secondary | ICD-10-CM | POA: Diagnosis not present

## 2016-12-29 DIAGNOSIS — K3189 Other diseases of stomach and duodenum: Secondary | ICD-10-CM | POA: Diagnosis not present

## 2016-12-29 DIAGNOSIS — I639 Cerebral infarction, unspecified: Secondary | ICD-10-CM | POA: Diagnosis not present

## 2016-12-29 DIAGNOSIS — I5032 Chronic diastolic (congestive) heart failure: Secondary | ICD-10-CM | POA: Diagnosis not present

## 2016-12-29 DIAGNOSIS — N179 Acute kidney failure, unspecified: Secondary | ICD-10-CM | POA: Diagnosis not present

## 2016-12-29 DIAGNOSIS — S32000A Wedge compression fracture of unspecified lumbar vertebra, initial encounter for closed fracture: Secondary | ICD-10-CM | POA: Diagnosis not present

## 2016-12-29 DIAGNOSIS — E119 Type 2 diabetes mellitus without complications: Secondary | ICD-10-CM

## 2016-12-30 DIAGNOSIS — I5032 Chronic diastolic (congestive) heart failure: Secondary | ICD-10-CM | POA: Diagnosis not present

## 2016-12-30 DIAGNOSIS — N179 Acute kidney failure, unspecified: Secondary | ICD-10-CM | POA: Diagnosis not present

## 2016-12-30 DIAGNOSIS — E119 Type 2 diabetes mellitus without complications: Secondary | ICD-10-CM | POA: Diagnosis not present

## 2016-12-30 DIAGNOSIS — S32000A Wedge compression fracture of unspecified lumbar vertebra, initial encounter for closed fracture: Secondary | ICD-10-CM | POA: Diagnosis not present

## 2016-12-30 DIAGNOSIS — K3189 Other diseases of stomach and duodenum: Secondary | ICD-10-CM | POA: Diagnosis not present

## 2016-12-30 DIAGNOSIS — I639 Cerebral infarction, unspecified: Secondary | ICD-10-CM | POA: Diagnosis not present

## 2016-12-30 DIAGNOSIS — R112 Nausea with vomiting, unspecified: Secondary | ICD-10-CM | POA: Diagnosis not present

## 2017-03-13 ENCOUNTER — Other Ambulatory Visit: Payer: Self-pay | Admitting: Cardiovascular Disease

## 2017-03-14 NOTE — Telephone Encounter (Signed)
Rx(s) sent to pharmacy electronically.  

## 2017-06-03 DIAGNOSIS — I5033 Acute on chronic diastolic (congestive) heart failure: Secondary | ICD-10-CM | POA: Diagnosis not present

## 2017-06-03 DIAGNOSIS — N189 Chronic kidney disease, unspecified: Secondary | ICD-10-CM

## 2017-06-03 DIAGNOSIS — I739 Peripheral vascular disease, unspecified: Secondary | ICD-10-CM | POA: Diagnosis not present

## 2017-06-03 DIAGNOSIS — R0902 Hypoxemia: Secondary | ICD-10-CM

## 2017-06-03 DIAGNOSIS — E1165 Type 2 diabetes mellitus with hyperglycemia: Secondary | ICD-10-CM | POA: Diagnosis not present

## 2017-06-03 DIAGNOSIS — R109 Unspecified abdominal pain: Secondary | ICD-10-CM

## 2017-06-04 DIAGNOSIS — E1165 Type 2 diabetes mellitus with hyperglycemia: Secondary | ICD-10-CM | POA: Diagnosis not present

## 2017-06-04 DIAGNOSIS — I739 Peripheral vascular disease, unspecified: Secondary | ICD-10-CM | POA: Diagnosis not present

## 2017-06-04 DIAGNOSIS — I5033 Acute on chronic diastolic (congestive) heart failure: Secondary | ICD-10-CM | POA: Diagnosis not present

## 2017-06-04 DIAGNOSIS — N189 Chronic kidney disease, unspecified: Secondary | ICD-10-CM | POA: Diagnosis not present

## 2017-06-04 DIAGNOSIS — R109 Unspecified abdominal pain: Secondary | ICD-10-CM | POA: Diagnosis not present

## 2017-06-04 DIAGNOSIS — R0902 Hypoxemia: Secondary | ICD-10-CM | POA: Diagnosis not present

## 2017-06-05 DIAGNOSIS — N189 Chronic kidney disease, unspecified: Secondary | ICD-10-CM | POA: Diagnosis not present

## 2017-06-05 DIAGNOSIS — R0902 Hypoxemia: Secondary | ICD-10-CM | POA: Diagnosis not present

## 2017-06-05 DIAGNOSIS — I5033 Acute on chronic diastolic (congestive) heart failure: Secondary | ICD-10-CM | POA: Diagnosis not present

## 2017-06-05 DIAGNOSIS — R109 Unspecified abdominal pain: Secondary | ICD-10-CM | POA: Diagnosis not present

## 2017-06-05 DIAGNOSIS — I739 Peripheral vascular disease, unspecified: Secondary | ICD-10-CM | POA: Diagnosis not present

## 2017-06-06 DIAGNOSIS — I5033 Acute on chronic diastolic (congestive) heart failure: Secondary | ICD-10-CM | POA: Diagnosis not present

## 2017-06-06 DIAGNOSIS — R0902 Hypoxemia: Secondary | ICD-10-CM | POA: Diagnosis not present

## 2017-06-06 DIAGNOSIS — N189 Chronic kidney disease, unspecified: Secondary | ICD-10-CM | POA: Diagnosis not present

## 2017-06-06 DIAGNOSIS — R109 Unspecified abdominal pain: Secondary | ICD-10-CM | POA: Diagnosis not present

## 2017-06-07 DIAGNOSIS — R109 Unspecified abdominal pain: Secondary | ICD-10-CM | POA: Diagnosis not present

## 2017-06-07 DIAGNOSIS — I739 Peripheral vascular disease, unspecified: Secondary | ICD-10-CM | POA: Diagnosis not present

## 2017-06-07 DIAGNOSIS — N189 Chronic kidney disease, unspecified: Secondary | ICD-10-CM | POA: Diagnosis not present

## 2017-06-07 DIAGNOSIS — R0902 Hypoxemia: Secondary | ICD-10-CM | POA: Diagnosis not present

## 2017-06-07 DIAGNOSIS — I5033 Acute on chronic diastolic (congestive) heart failure: Secondary | ICD-10-CM | POA: Diagnosis not present

## 2017-06-07 DIAGNOSIS — E1165 Type 2 diabetes mellitus with hyperglycemia: Secondary | ICD-10-CM | POA: Diagnosis not present

## 2017-07-02 DIAGNOSIS — D696 Thrombocytopenia, unspecified: Secondary | ICD-10-CM | POA: Diagnosis not present

## 2017-07-02 DIAGNOSIS — W19XXXA Unspecified fall, initial encounter: Secondary | ICD-10-CM | POA: Diagnosis not present

## 2017-07-02 DIAGNOSIS — N390 Urinary tract infection, site not specified: Secondary | ICD-10-CM | POA: Diagnosis not present

## 2017-07-02 DIAGNOSIS — R262 Difficulty in walking, not elsewhere classified: Secondary | ICD-10-CM

## 2017-07-02 DIAGNOSIS — I4891 Unspecified atrial fibrillation: Secondary | ICD-10-CM

## 2017-07-02 DIAGNOSIS — I5033 Acute on chronic diastolic (congestive) heart failure: Secondary | ICD-10-CM | POA: Diagnosis not present

## 2017-07-02 DIAGNOSIS — N179 Acute kidney failure, unspecified: Secondary | ICD-10-CM | POA: Diagnosis not present

## 2017-07-02 DIAGNOSIS — E039 Hypothyroidism, unspecified: Secondary | ICD-10-CM | POA: Diagnosis not present

## 2017-07-02 DIAGNOSIS — J9611 Chronic respiratory failure with hypoxia: Secondary | ICD-10-CM

## 2017-07-02 DIAGNOSIS — G9349 Other encephalopathy: Secondary | ICD-10-CM

## 2017-07-02 DIAGNOSIS — I739 Peripheral vascular disease, unspecified: Secondary | ICD-10-CM | POA: Diagnosis not present

## 2017-07-02 DIAGNOSIS — E785 Hyperlipidemia, unspecified: Secondary | ICD-10-CM

## 2017-07-02 DIAGNOSIS — E872 Acidosis: Secondary | ICD-10-CM | POA: Diagnosis not present

## 2017-07-02 DIAGNOSIS — J159 Unspecified bacterial pneumonia: Secondary | ICD-10-CM

## 2017-07-02 DIAGNOSIS — E119 Type 2 diabetes mellitus without complications: Secondary | ICD-10-CM | POA: Diagnosis not present

## 2017-07-03 DIAGNOSIS — N179 Acute kidney failure, unspecified: Secondary | ICD-10-CM | POA: Diagnosis not present

## 2017-07-03 DIAGNOSIS — I739 Peripheral vascular disease, unspecified: Secondary | ICD-10-CM | POA: Diagnosis not present

## 2017-07-03 DIAGNOSIS — E785 Hyperlipidemia, unspecified: Secondary | ICD-10-CM | POA: Diagnosis not present

## 2017-07-03 DIAGNOSIS — I4891 Unspecified atrial fibrillation: Secondary | ICD-10-CM | POA: Diagnosis not present

## 2017-07-03 DIAGNOSIS — E119 Type 2 diabetes mellitus without complications: Secondary | ICD-10-CM | POA: Diagnosis not present

## 2017-07-03 DIAGNOSIS — E039 Hypothyroidism, unspecified: Secondary | ICD-10-CM | POA: Diagnosis not present

## 2017-07-03 DIAGNOSIS — E872 Acidosis: Secondary | ICD-10-CM | POA: Diagnosis not present

## 2017-07-03 DIAGNOSIS — W19XXXA Unspecified fall, initial encounter: Secondary | ICD-10-CM | POA: Diagnosis not present

## 2017-07-03 DIAGNOSIS — I5033 Acute on chronic diastolic (congestive) heart failure: Secondary | ICD-10-CM | POA: Diagnosis not present

## 2017-07-03 DIAGNOSIS — R262 Difficulty in walking, not elsewhere classified: Secondary | ICD-10-CM | POA: Diagnosis not present

## 2017-07-03 DIAGNOSIS — D696 Thrombocytopenia, unspecified: Secondary | ICD-10-CM | POA: Diagnosis not present

## 2017-07-03 DIAGNOSIS — N390 Urinary tract infection, site not specified: Secondary | ICD-10-CM | POA: Diagnosis not present

## 2017-07-04 DIAGNOSIS — D696 Thrombocytopenia, unspecified: Secondary | ICD-10-CM | POA: Diagnosis not present

## 2017-07-04 DIAGNOSIS — E119 Type 2 diabetes mellitus without complications: Secondary | ICD-10-CM | POA: Diagnosis not present

## 2017-07-04 DIAGNOSIS — N390 Urinary tract infection, site not specified: Secondary | ICD-10-CM | POA: Diagnosis not present

## 2017-07-04 DIAGNOSIS — I4891 Unspecified atrial fibrillation: Secondary | ICD-10-CM | POA: Diagnosis not present

## 2017-07-04 DIAGNOSIS — R262 Difficulty in walking, not elsewhere classified: Secondary | ICD-10-CM | POA: Diagnosis not present

## 2017-07-04 DIAGNOSIS — I739 Peripheral vascular disease, unspecified: Secondary | ICD-10-CM | POA: Diagnosis not present

## 2017-07-04 DIAGNOSIS — E872 Acidosis: Secondary | ICD-10-CM | POA: Diagnosis not present

## 2017-07-04 DIAGNOSIS — E785 Hyperlipidemia, unspecified: Secondary | ICD-10-CM | POA: Diagnosis not present

## 2017-07-04 DIAGNOSIS — I5033 Acute on chronic diastolic (congestive) heart failure: Secondary | ICD-10-CM | POA: Diagnosis not present

## 2017-07-04 DIAGNOSIS — W19XXXA Unspecified fall, initial encounter: Secondary | ICD-10-CM | POA: Diagnosis not present

## 2017-07-04 DIAGNOSIS — N179 Acute kidney failure, unspecified: Secondary | ICD-10-CM | POA: Diagnosis not present

## 2017-07-04 DIAGNOSIS — E039 Hypothyroidism, unspecified: Secondary | ICD-10-CM | POA: Diagnosis not present

## 2017-07-05 DIAGNOSIS — I4891 Unspecified atrial fibrillation: Secondary | ICD-10-CM | POA: Diagnosis not present

## 2017-07-05 DIAGNOSIS — I739 Peripheral vascular disease, unspecified: Secondary | ICD-10-CM | POA: Diagnosis not present

## 2017-07-05 DIAGNOSIS — I5033 Acute on chronic diastolic (congestive) heart failure: Secondary | ICD-10-CM | POA: Diagnosis not present

## 2017-07-05 DIAGNOSIS — E039 Hypothyroidism, unspecified: Secondary | ICD-10-CM | POA: Diagnosis not present

## 2017-07-05 DIAGNOSIS — E785 Hyperlipidemia, unspecified: Secondary | ICD-10-CM | POA: Diagnosis not present

## 2017-07-05 DIAGNOSIS — R262 Difficulty in walking, not elsewhere classified: Secondary | ICD-10-CM | POA: Diagnosis not present

## 2017-07-05 DIAGNOSIS — W19XXXA Unspecified fall, initial encounter: Secondary | ICD-10-CM | POA: Diagnosis not present

## 2017-07-06 ENCOUNTER — Ambulatory Visit: Payer: Medicare Other | Admitting: Cardiovascular Disease

## 2017-07-06 DIAGNOSIS — D696 Thrombocytopenia, unspecified: Secondary | ICD-10-CM | POA: Diagnosis not present

## 2017-07-06 DIAGNOSIS — E872 Acidosis: Secondary | ICD-10-CM | POA: Diagnosis not present

## 2017-07-06 DIAGNOSIS — N390 Urinary tract infection, site not specified: Secondary | ICD-10-CM | POA: Diagnosis not present

## 2017-07-06 DIAGNOSIS — R262 Difficulty in walking, not elsewhere classified: Secondary | ICD-10-CM | POA: Diagnosis not present

## 2017-07-06 DIAGNOSIS — E119 Type 2 diabetes mellitus without complications: Secondary | ICD-10-CM | POA: Diagnosis not present

## 2017-07-06 DIAGNOSIS — I4891 Unspecified atrial fibrillation: Secondary | ICD-10-CM | POA: Diagnosis not present

## 2017-07-06 DIAGNOSIS — W19XXXA Unspecified fall, initial encounter: Secondary | ICD-10-CM | POA: Diagnosis not present

## 2017-07-06 DIAGNOSIS — I5033 Acute on chronic diastolic (congestive) heart failure: Secondary | ICD-10-CM | POA: Diagnosis not present

## 2017-07-06 DIAGNOSIS — I739 Peripheral vascular disease, unspecified: Secondary | ICD-10-CM | POA: Diagnosis not present

## 2017-07-06 DIAGNOSIS — E785 Hyperlipidemia, unspecified: Secondary | ICD-10-CM | POA: Diagnosis not present

## 2017-08-04 DEATH — deceased
